# Patient Record
Sex: Male | Born: 1975 | Race: White | Hispanic: No | Marital: Married | Smoking: Never smoker
Health system: Southern US, Community
[De-identification: ages and names within clinical notes are randomized; demographics above are authoritative.]

---

## 2006-12-07 ENCOUNTER — Ambulatory Visit: Payer: Self-pay | Admitting: Family Medicine

## 2006-12-08 ENCOUNTER — Encounter: Admission: RE | Admit: 2006-12-08 | Discharge: 2006-12-08 | Payer: Self-pay | Admitting: Family Medicine

## 2006-12-14 ENCOUNTER — Encounter: Admission: RE | Admit: 2006-12-14 | Discharge: 2006-12-14 | Payer: Self-pay | Admitting: Family Medicine

## 2007-06-21 ENCOUNTER — Ambulatory Visit: Payer: Self-pay | Admitting: Family Medicine

## 2008-04-14 ENCOUNTER — Ambulatory Visit: Payer: Self-pay | Admitting: Family Medicine

## 2008-05-09 IMAGING — US US ABDOMEN COMPLETE
1 series · 14 of 25 positions shown · non-contrast
Comparison: None.

CLINICAL DATA: Right upper quadrant abdominal pain.

COMPLETE ABDOMEN ULTRASOUND
TECHNIQUE: Complete abdominal ultrasound examination was performed including
evaluation of the liver, gallbladder, bile ducts, pancreas, kidneys, spleen,
IVC, and abdominal aorta.

[Series 1: unknown · 0.32mm/px · 14 of 78 slices shown]
[im 1/78]
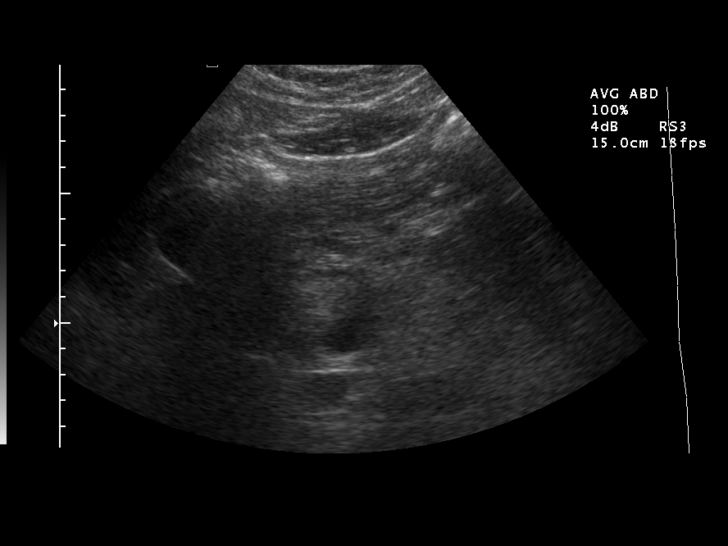
[im 7/78]
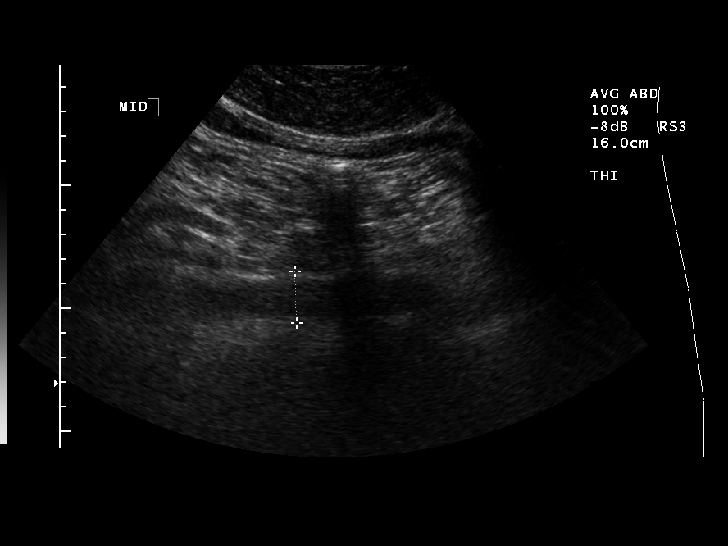
[im 13/78]
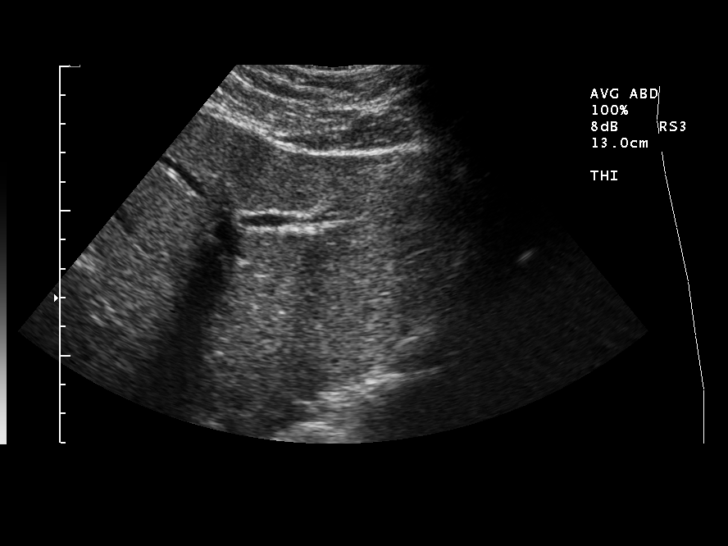
[im 20/78]
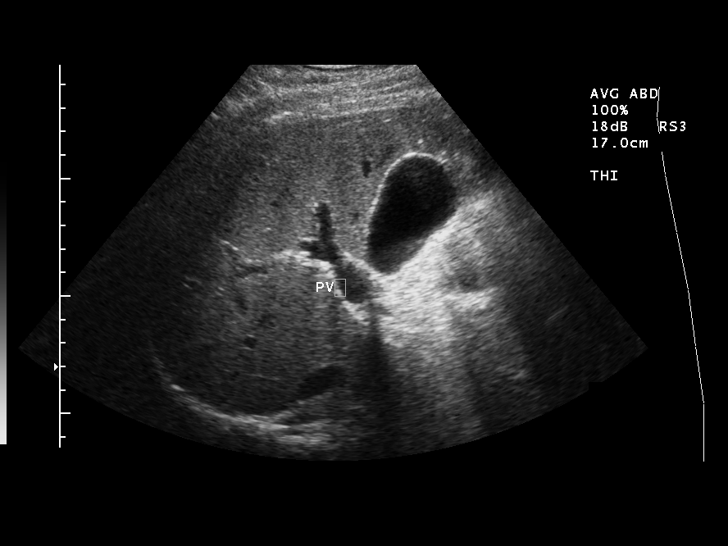
[im 26/78]
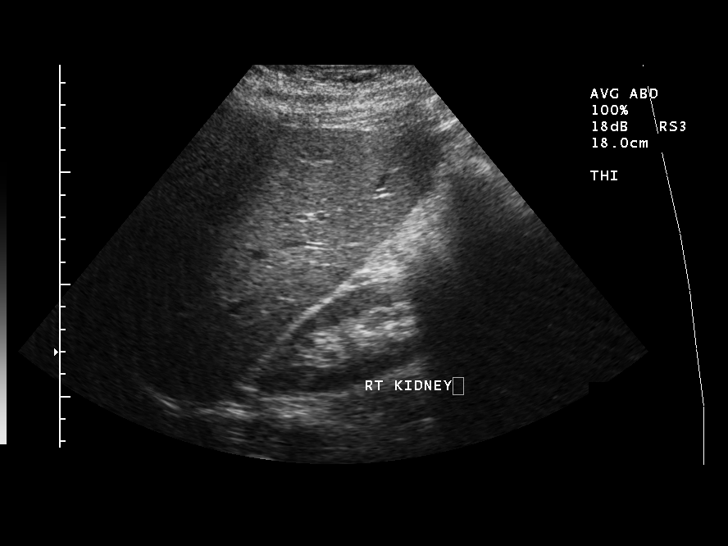
[im 29/78]
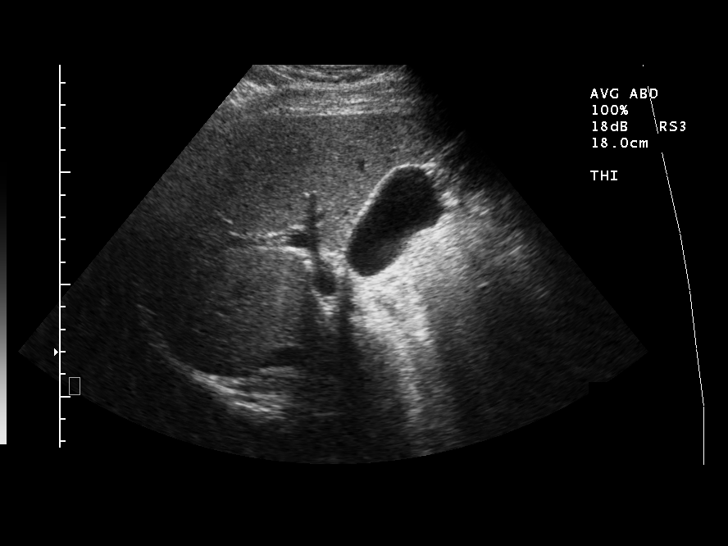
[im 36/78]
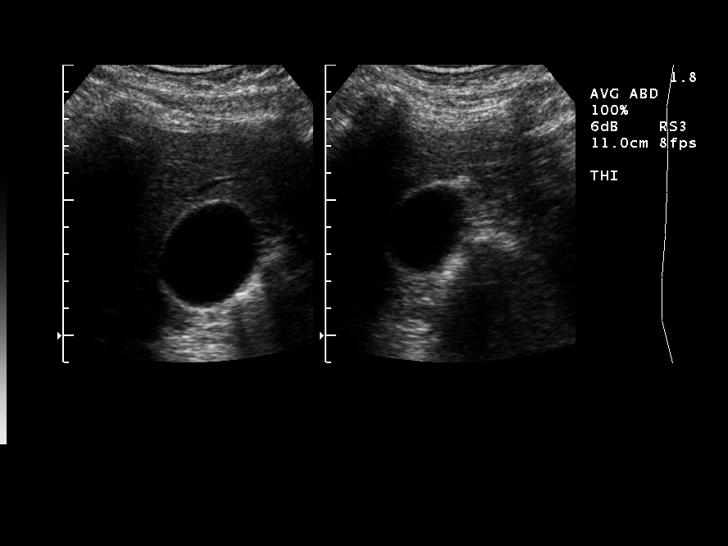
[im 42/78]
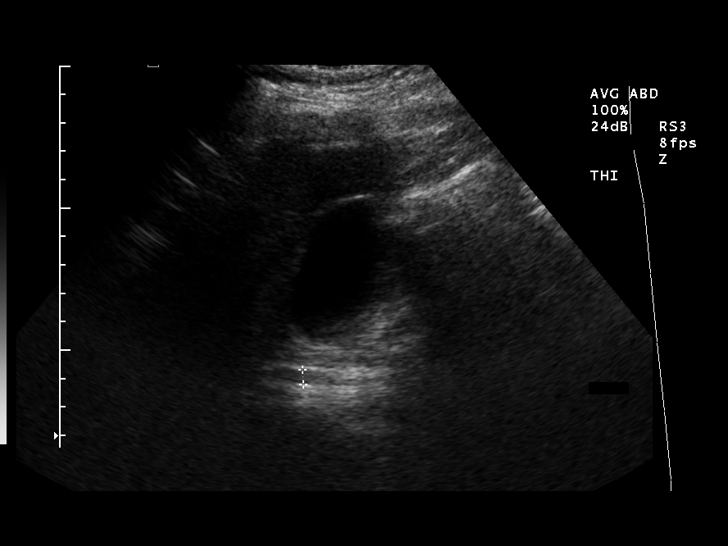
[im 49/78]
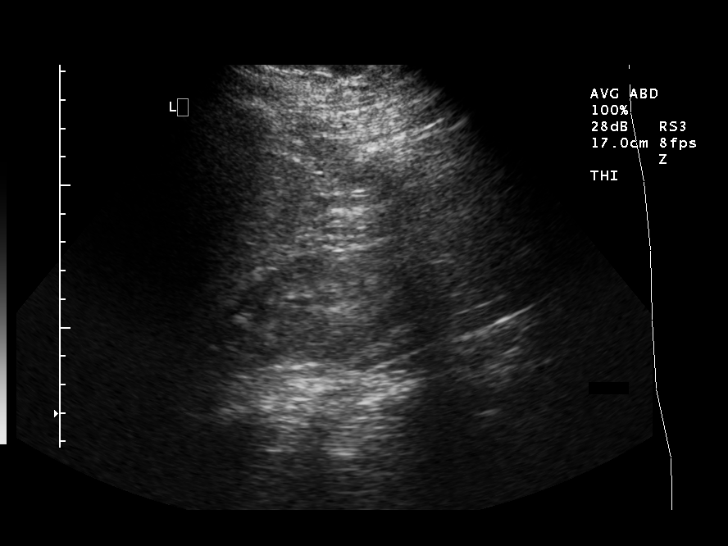
[im 52/78]
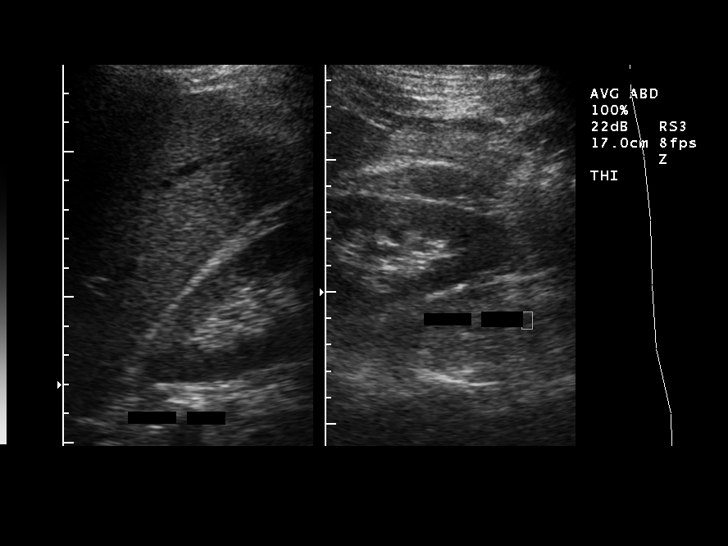
[im 58/78]
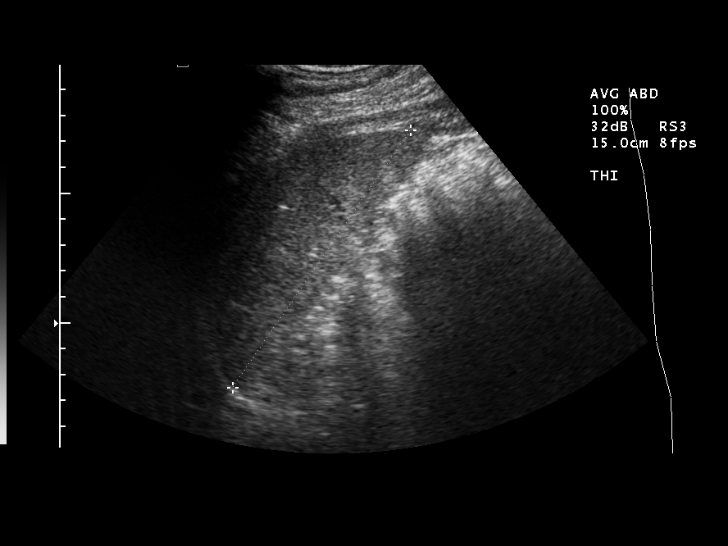
[im 65/78]
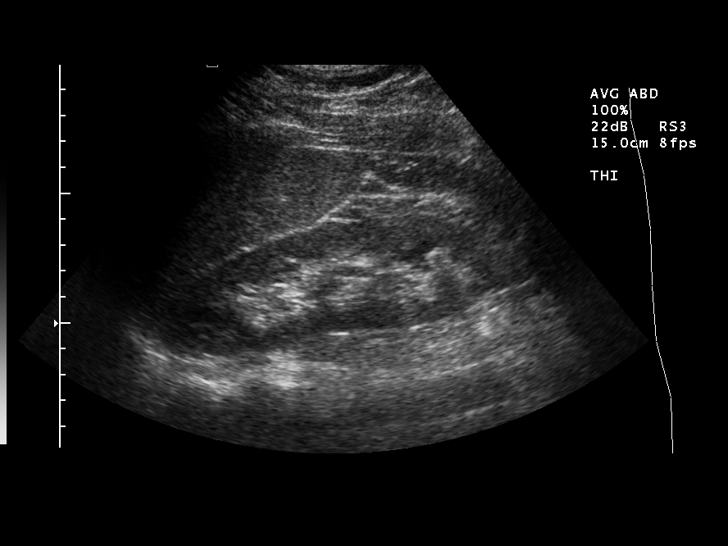
[im 71/78]
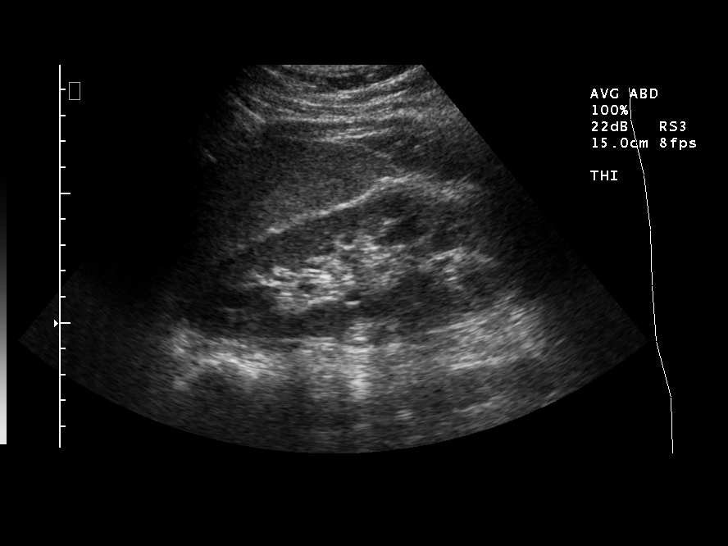
[im 78/78]
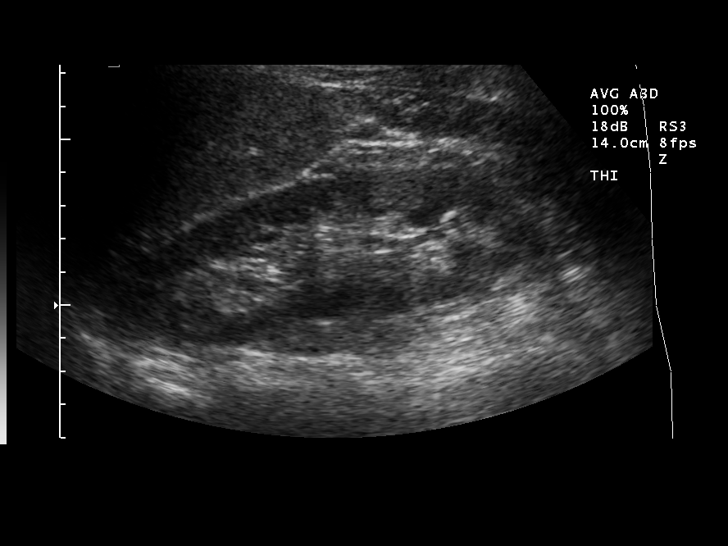

[14 of 25 positions shown; findings below may reference images not displayed]

FINDINGS: Poorly visualized pancreas and portions of the inferior vena cava.
The gallbladder, liver, spleen, kidneys and abdominal aorta have normal
appearances. No gallstones, biliary ductal dilatation or free peritoneal fluid. 
The common duct measures 5.2 mm in maximum diameter proximally.

IMPRESSION

Poorly visualized pancreas and portions of the inferior vena cava. Otherwise,
normal examination.

## 2008-05-15 IMAGING — NM NM HEPATO W/GB/PHARM/[PERSON_NAME]
5 series · 10 of 10 positions shown · non-contrast
Comparison: Abdominal Ultrasound, 12/08/06.

CLINICAL DATA: Right upper quadrant abdominal pain.
 HEPATOBILIARY SCAN WITH GALLBLADDER EJECTION FRACTION:
TECHNIQUE: Sequential abdominal images were obtained following intravenous injection of radiopharmaceutical.  Sequential images were continued following oral ingestion of 8 oz. Half & Half Cream, and the gallbladder ejection fraction was calculated.
 Radiopharmaceutical:  5 mCi 1c-77m Choletec

[gb hepatobiliary · 1 of 1 slices shown (1 of 5)]
[im 1/1]
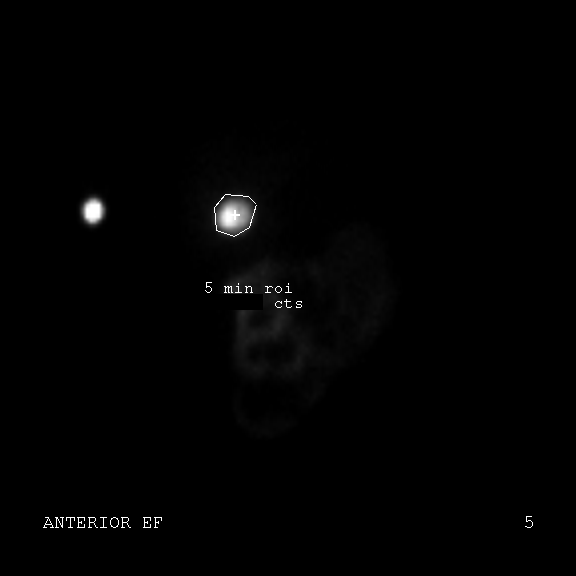

[gb hepatobiliary · 1 of 1 slices shown (2 of 5)]
[im 1/1]
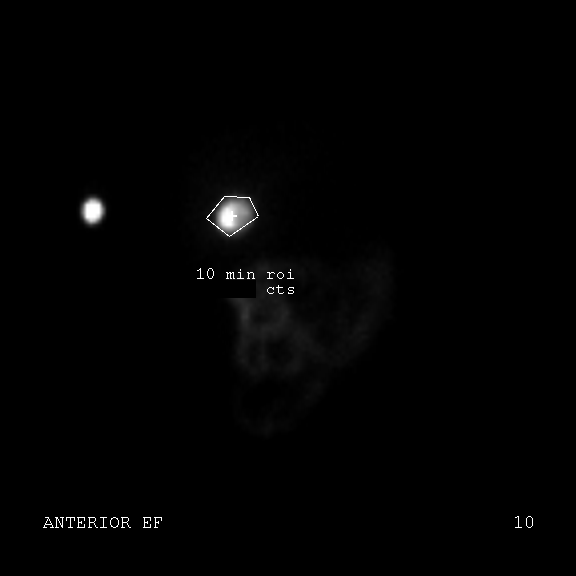

[gb hepatobiliary · 4.66mm/px · 6 of 12 frames shown (3 of 5)]
[frame 2/12]
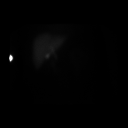
[frame 4/12]
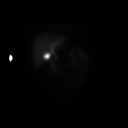
[frame 6/12]
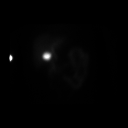
[frame 8/12]
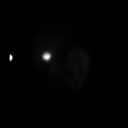
[frame 10/12]
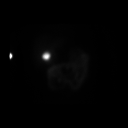
[frame 12/12]
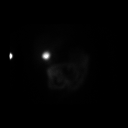

[gb hepatobiliary · 1 of 1 slices shown (4 of 5)]
[im 1/1]
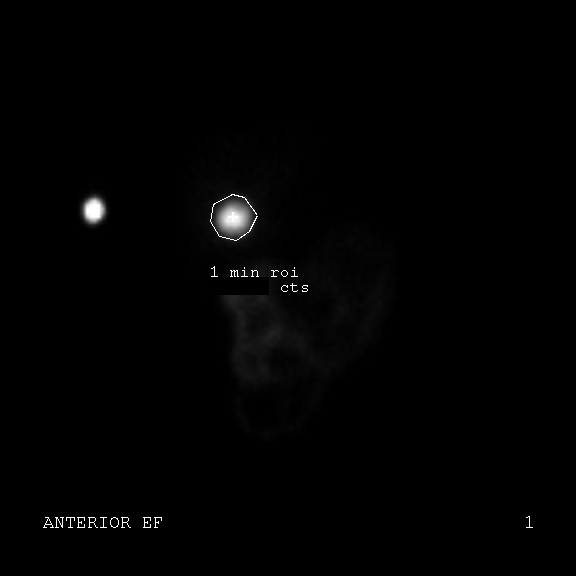

[gb hepatobiliary · 1 of 1 slices shown (5 of 5)]
[im 1/1]
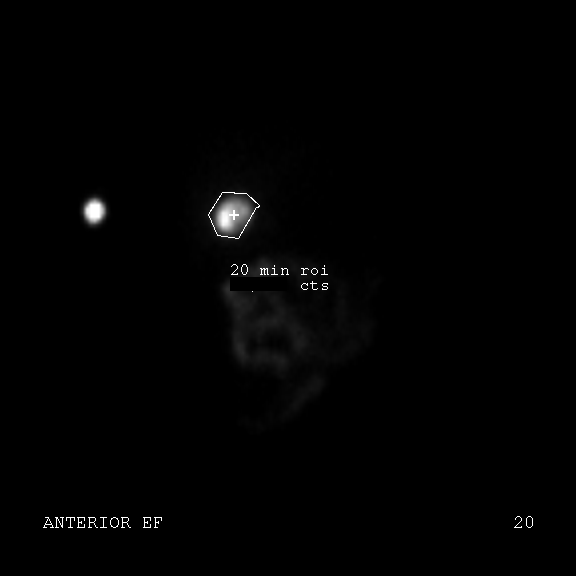

[10 of 10 positions shown; findings below may reference images not displayed]

FINDINGS: The initial images demonstrate homogeneous hepatic activity and prompt filling of the gallbladder. There is spontaneous drainage into the small bowel.  
 The stimulated portion demonstrates relatively limited gallbladder contraction.  The gallbladder ejection fraction calculated at one hour is 33%.  Normal is considered greater than 50% at this interval.
IMPRESSION: 1.  The cystic and common bile ducts are patent. 
 2.  Gallbladder ejection fraction is mildly decreased at 33% at one hour.

## 2008-10-09 ENCOUNTER — Ambulatory Visit (HOSPITAL_BASED_OUTPATIENT_CLINIC_OR_DEPARTMENT_OTHER): Admission: RE | Admit: 2008-10-09 | Discharge: 2008-10-10 | Payer: Self-pay | Admitting: Orthopedic Surgery

## 2008-10-27 ENCOUNTER — Ambulatory Visit: Payer: Self-pay | Admitting: Family Medicine

## 2009-03-12 ENCOUNTER — Encounter (INDEPENDENT_AMBULATORY_CARE_PROVIDER_SITE_OTHER): Payer: Self-pay | Admitting: *Deleted

## 2009-03-12 ENCOUNTER — Ambulatory Visit: Payer: Self-pay | Admitting: Sports Medicine

## 2009-03-12 DIAGNOSIS — M7650 Patellar tendinitis, unspecified knee: Secondary | ICD-10-CM | POA: Insufficient documentation

## 2009-07-30 ENCOUNTER — Ambulatory Visit: Payer: Self-pay | Admitting: Sports Medicine

## 2009-07-30 DIAGNOSIS — M722 Plantar fascial fibromatosis: Secondary | ICD-10-CM | POA: Insufficient documentation

## 2009-07-30 DIAGNOSIS — M7660 Achilles tendinitis, unspecified leg: Secondary | ICD-10-CM | POA: Insufficient documentation

## 2009-07-30 DIAGNOSIS — M2140 Flat foot [pes planus] (acquired), unspecified foot: Secondary | ICD-10-CM | POA: Insufficient documentation

## 2009-09-10 ENCOUNTER — Ambulatory Visit: Payer: Self-pay | Admitting: Sports Medicine

## 2009-09-30 ENCOUNTER — Ambulatory Visit: Payer: Self-pay | Admitting: Sports Medicine

## 2009-09-30 DIAGNOSIS — M79609 Pain in unspecified limb: Secondary | ICD-10-CM | POA: Insufficient documentation

## 2009-12-16 ENCOUNTER — Ambulatory Visit: Payer: Self-pay | Admitting: Family Medicine

## 2010-01-27 ENCOUNTER — Ambulatory Visit: Payer: Self-pay | Admitting: Family Medicine

## 2010-09-23 ENCOUNTER — Ambulatory Visit: Payer: Self-pay | Admitting: Family Medicine

## 2011-04-05 ENCOUNTER — Ambulatory Visit (INDEPENDENT_AMBULATORY_CARE_PROVIDER_SITE_OTHER): Payer: BC Managed Care – PPO | Admitting: Family Medicine

## 2011-04-05 DIAGNOSIS — J209 Acute bronchitis, unspecified: Secondary | ICD-10-CM

## 2011-04-26 NOTE — Op Note (Signed)
NAMEJERON, Jerry Powers            ACCOUNT NO.:  [id number]   MEDICAL RECORD NO.:  [id number]          PATIENT TYPE:  AMB   LOCATION:  [facility]                          FACILITY:  [facility]   PHYSICIAN:  Loreta Ave, M.D. DATE OF BIRTH:  1976-10-29   DATE OF PROCEDURE:  10/09/2008  DATE OF DISCHARGE:                               OPERATIVE REPORT   PREOPERATIVE DIAGNOSES:  1. Impingement.  2. Distal clavicle osteolysis.  3. Partial tearing rotator cuff, right shoulder.   POSTOPERATIVE DIAGNOSES:  1. Impingement.  2. Distal clavicle osteolysis.  3. Partial tearing rotator cuff, right shoulder.  4. Anterior labrum tear.   PROCEDURE:  1. Right shoulder exam under anesthesia.  2. Arthroscopy with debridement of rotator cuff labrum.  3. Bursectomy.  4. Acromioplasty.  5. CA ligament release.  6. Excision distal clavicle.   SURGEON:  Loreta Ave, MD   ASSISTANT:  Genene Churn. Barry Dienes, [place]   ANESTHESIA:  General.   BLOOD LOSS:  Minimal.   SPECIMENS:  None.   COMPLICATIONS:  None.   DRESSINGS:  Soft compressive with sling.   PROCEDURE:  The patient was brought to the operating room, placed on  operating table in supine position.  After adequate anesthesia had been  obtained, right shoulder examined.  Good motion, good stability.  Placed  in beach-chair position on the shoulder positioner, prepped and draped  in usual sterile fashion.  Three portals created, one each anterior,  posterior, and lateral.  Shoulder entered with blunt obturator,  arthroscope introduced, shoulder distended and inspected.  Complex  tearing anterior and posterior labrum debrided.  No instability pattern.  Biceps tendon, biceps anchor, capsule ligamentous structures, articular  cartilage intact.  From below, the rotator cuff looked excellent.  Cannula redirected subacromially.  Obvious impingement, type 2 acromion  abrasive change on top of the cuff.  No full-thickness tears.  Cuff  debrided.  Bursa  resected.  Acromioplasty to a type 1 acromion releasing  CA ligament and cautery.  Distal clavicle grade 4 changes with marked  spurring.  Periarticular spurs and lateral centimeter of clavicle  resected.  Adequacy of decompression, clavicle excision, and cuff  debridement confirmed viewing from all portals.  Instruments and fluid  removed.  Portals injected with Marcaine, closed with nylon.  Sterile  compressive dressing applied.  Sling applied.  Anesthesia reversed.  Brought to recovery room.  Tolerated the surgery well.  No  complications.      Loreta Ave, M.D.  Electronically Signed     DFM/MEDQ  D:  10/09/2008  T:  10/10/2008  Job:  [id number]

## 2011-11-18 ENCOUNTER — Ambulatory Visit: Payer: BC Managed Care – PPO

## 2011-12-23 ENCOUNTER — Other Ambulatory Visit: Payer: Self-pay | Admitting: Family Medicine

## 2011-12-23 NOTE — Telephone Encounter (Signed)
Is this ok?

## 2012-01-11 ENCOUNTER — Ambulatory Visit (INDEPENDENT_AMBULATORY_CARE_PROVIDER_SITE_OTHER): Payer: BC Managed Care – PPO | Admitting: Family Medicine

## 2012-01-11 ENCOUNTER — Encounter: Payer: Self-pay | Admitting: Family Medicine

## 2012-01-11 VITALS — BP 112/70 | HR 64 | Temp 98.0°F | Ht 72.0 in | Wt 200.0 lb

## 2012-01-11 DIAGNOSIS — R197 Diarrhea, unspecified: Secondary | ICD-10-CM

## 2012-01-11 NOTE — Patient Instructions (Signed)
Use Imodium for the diarrhea and eat anything you want. If you still have symptoms next week, let me know

## 2012-01-11 NOTE — Progress Notes (Signed)
  Subjective:    Patient ID: Jerry Powers, male    DOB: 03-Nov-1976, 36 y.o.   MRN: [id number]  HPI He has a three-day history of diarrhea but no fever, chills, blood or pus in the stool, nausea or vomiting. No other family members have had difficulty. He has not been traveling or been drinking well water. He cannot relate this to any foods.   Review of Systems     Objective:   Physical Exam alert and in no distress. Tympanic membranes and canals are normal. Throat is clear. Tonsils are normal. Neck is supple without adenopathy or thyromegaly. Cardiac exam shows a regular sinus rhythm without murmurs or gallops. Lungs are clear to auscultation.        Assessment & Plan:  Diarrhea. Supportive care including using Imodium. He was instructed to eating anything he wants

## 2012-07-04 ENCOUNTER — Ambulatory Visit (INDEPENDENT_AMBULATORY_CARE_PROVIDER_SITE_OTHER): Payer: BC Managed Care – PPO | Admitting: Family Medicine

## 2012-07-04 ENCOUNTER — Encounter: Payer: Self-pay | Admitting: Family Medicine

## 2012-07-04 VITALS — BP 110/70 | HR 61 | Temp 98.1°F | Wt 198.0 lb

## 2012-07-04 DIAGNOSIS — H6980 Other specified disorders of Eustachian tube, unspecified ear: Secondary | ICD-10-CM

## 2012-07-04 DIAGNOSIS — J309 Allergic rhinitis, unspecified: Secondary | ICD-10-CM

## 2012-07-04 DIAGNOSIS — H6120 Impacted cerumen, unspecified ear: Secondary | ICD-10-CM

## 2012-07-04 NOTE — Patient Instructions (Signed)
Use Afrin daily for the next 3 days and also use a nasal steroid spray

## 2012-07-04 NOTE — Progress Notes (Signed)
  Subjective:    Patient ID: Jerry Powers, male    DOB: Aug 18, 1976, 36 y.o.   MRN: [id number]  HPI About 3 days ago he developed left ear congestion and popping sensation followed by some sinus pressure and occasional PND. He now is having some difficulty with dizziness. No fever, chills, sore throat, upper tooth discomfort. He does have underlying allergies and usually uses Sudafed and chlorpheniramine. He does not smoke.   Review of Systems     Objective:   Physical Exam alert and in no distress. Tympanic membranes and canals are normal after cerumen was removed from the left canal. Throat is clear. Tonsils are normal. Neck is supple without adenopathy or thyromegaly. Cardiac exam shows a regular sinus rhythm without murmurs or gallops. Lungs are clear to auscultation.        Assessment & Plan:   1. Eustachian tube dysfunction  Ear cerumen removal  2. Allergic rhinitis, mild     he will continue on his allergy medications and start taking the nasal steroid again. Recommended 3 days of Afrin nasal spray to see if this will help with his ear symptoms. Explained that I did not think his symptoms were infection related. He is comfortable with this approach.

## 2012-07-10 ENCOUNTER — Encounter: Payer: Self-pay | Admitting: Family Medicine

## 2012-07-10 ENCOUNTER — Ambulatory Visit (INDEPENDENT_AMBULATORY_CARE_PROVIDER_SITE_OTHER): Payer: BC Managed Care – PPO | Admitting: Family Medicine

## 2012-07-10 VITALS — BP 110/70 | HR 60 | Temp 97.8°F | Wt 197.0 lb

## 2012-07-10 DIAGNOSIS — H60399 Other infective otitis externa, unspecified ear: Secondary | ICD-10-CM

## 2012-07-10 DIAGNOSIS — H6092 Unspecified otitis externa, left ear: Secondary | ICD-10-CM

## 2012-07-10 MED ORDER — NEOMYCIN-POLYMYXIN-HC 3.5-10000-1 OT SUSP: 3.0000 [drp] | Freq: Three times a day (TID) | OTIC | Status: AC

## 2012-07-10 NOTE — Progress Notes (Signed)
  Subjective:    Patient ID: Jerry Powers, male    DOB: 06/03/1976, 36 y.o.   MRN: [id number]  HPI A recheck on left ear pain. He does state that he is having less difficulty with congestion but not having difficulty with pain. He notes that if he chews or moves his ear, he will have pain.   Review of Systems     Objective:   Physical Exam Right TM and canal normal. Left TM is normal however the canal is slightly erythematous. Neck is supple without adenopathy. Throat is clear.       Assessment & Plan:   1. Otitis externa, left  neomycin-polymyxin-hydrocortisone (CORTISPORIN) 3.5-10000-1 otic suspension   also recommend anti-inflammatory of choice. Call if further difficulty.

## 2013-01-22 ENCOUNTER — Other Ambulatory Visit: Payer: Self-pay | Admitting: Family Medicine

## 2013-01-23 NOTE — Telephone Encounter (Signed)
Is this okay to refill? 

## 2013-07-30 ENCOUNTER — Ambulatory Visit (INDEPENDENT_AMBULATORY_CARE_PROVIDER_SITE_OTHER): Payer: BC Managed Care – PPO | Admitting: Family Medicine

## 2013-07-30 ENCOUNTER — Encounter: Payer: Self-pay | Admitting: Family Medicine

## 2013-07-30 VITALS — BP 110/60 | HR 61 | Temp 98.1°F | Wt 191.0 lb

## 2013-07-30 DIAGNOSIS — J209 Acute bronchitis, unspecified: Secondary | ICD-10-CM

## 2013-07-30 DIAGNOSIS — J019 Acute sinusitis, unspecified: Secondary | ICD-10-CM

## 2013-07-30 MED ORDER — CLARITHROMYCIN 500 MG PO TABS: 500.0000 mg | ORAL_TABLET | Freq: Two times a day (BID) | ORAL | Status: DC

## 2013-07-30 NOTE — Progress Notes (Signed)
  Subjective:    Patient ID: Jerry Powers, male    DOB: [birth year], 37 y.o.   MRN: [id number]  HPI Approximately 2 weeks ago he developed a sore throat followed by nasal congestion, dry cough, slight sore throat did go away.. he did get worse approximately one week ago and did use Afrin which did help. He is now experiencing more nasal congestion, PND and productive cough.   Review of Systems     Objective:   Physical Exam alert and in no distress. Tympanic membranes and canals are normal. Throat is clear. Tonsils are normal. Neck is supple without adenopathy or thyromegaly. Cardiac exam shows a regular sinus rhythm without murmurs or gallops. Lungs are clear to auscultation. Nasal mucosa is slightly red and swollen, tender over frontal sinuses.        Assessment & Plan:  Acute sinusitis - Plan: clarithromycin (BIAXIN) 500 MG tablet  Acute bronchitis - Plan: clarithromycin (BIAXIN) 500 MG tablet  he is to call if not entirely better when he finishes the antibiotic.

## 2013-10-15 ENCOUNTER — Encounter: Payer: Self-pay | Admitting: Family Medicine

## 2013-10-15 ENCOUNTER — Ambulatory Visit (INDEPENDENT_AMBULATORY_CARE_PROVIDER_SITE_OTHER): Payer: 59 | Admitting: Family Medicine

## 2013-10-15 VITALS — BP 100/60 | HR 64 | Ht 72.0 in | Wt 188.0 lb

## 2013-10-15 DIAGNOSIS — Z8619 Personal history of other infectious and parasitic diseases: Secondary | ICD-10-CM

## 2013-10-15 DIAGNOSIS — Z23 Encounter for immunization: Secondary | ICD-10-CM

## 2013-10-15 DIAGNOSIS — J301 Allergic rhinitis due to pollen: Secondary | ICD-10-CM

## 2013-10-15 DIAGNOSIS — I7300 Raynaud's syndrome without gangrene: Secondary | ICD-10-CM

## 2013-10-15 DIAGNOSIS — Z0000 Encounter for general adult medical examination without abnormal findings: Secondary | ICD-10-CM

## 2013-10-15 LAB — CBC WITH DIFFERENTIAL/PLATELET
Basophils Absolute: 0 10*3/uL (ref 0.0–0.1)
Basophils Relative: 1 % (ref 0–1)
Hemoglobin: 15.6 g/dL (ref 13.0–17.0)
Lymphs Abs: 1.9 10*3/uL (ref 0.7–4.0)
MCHC: 34.8 g/dL (ref 30.0–36.0)
Neutro Abs: 2.7 10*3/uL (ref 1.7–7.7)
RBC: 4.96 MIL/uL (ref 4.22–5.81)
RDW: 13.5 % (ref 11.5–15.5)

## 2013-10-15 LAB — LIPID PANEL
HDL: 52 mg/dL (ref >39)
Total CHOL/HDL Ratio: 3.7 Ratio

## 2013-10-15 LAB — POCT URINALYSIS DIPSTICK
Ketones, UA: NEGATIVE
Leukocytes, UA: NEGATIVE
Protein, UA: NEGATIVE
Urobilinogen, UA: NEGATIVE
pH, UA: 7

## 2013-10-15 LAB — COMPREHENSIVE METABOLIC PANEL
AST: 26 U/L (ref 0–37)
BUN: 15 mg/dL (ref 6–23)
Calcium: 10 mg/dL (ref 8.4–10.5)
Chloride: 99 mEq/L (ref 96–112)
Creat: 1.11 mg/dL (ref 0.50–1.35)
Potassium: 4.1 mEq/L (ref 3.5–5.3)
Sodium: 137 mEq/L (ref 135–145)

## 2013-10-15 NOTE — Progress Notes (Signed)
Subjective:    Patient ID: Jerry Powers, male    DOB: [birth year], 37 y.o.   MRN: [id number]  HPI He is here for complete examination. He notes that last spring in the cold weather he noted his hands becoming purplish and in white. He has not had any trouble since then. He has had no fever, chills, weight change, pulmonary symptoms, GI problems. He does have underlying history of herpes labialis and does use Valtrex periodically. He also has underlying allergies mainly dealing with congestion. He also notes bilateral hand tingling sensation if he put something to restrictive on his forearms or when he is driving but then demonstrates a normal driving position of his hands and elbows. Family and social history were reviewed. He is engaged but does not have immediate plans to married.   Review of Systems  Constitutional: Negative.   HENT: Negative.   Eyes: Negative.   Respiratory: Negative.   Cardiovascular: Negative.   Gastrointestinal: Negative.   Endocrine: Negative.   Genitourinary: Negative.   Musculoskeletal: Negative.   Allergic/Immunologic: Negative.   Hematological: Negative.        Objective:   Physical Exam BP 100/60  Pulse 64  Ht 6' (1.829 m)  Wt 188 lb (85.276 kg)  BMI 25.49 kg/m2  SpO2 98%  General Appearance:    Alert, cooperative, no distress, appears stated age  Head:    Normocephalic, without obvious abnormality, atraumatic  Eyes:    PERRL, conjunctiva/corneas clear, EOM's intact, fundi    benign  Ears:    Normal TM's and external ear canals  Nose:   Nares normal, mucosa normal, no drainage or sinus   tenderness  Throat:   Lips, mucosa, and tongue normal; teeth and gums normal  Neck:   Supple, no lymphadenopathy;  thyroid:  no   enlargement/tenderness/nodules; no carotid   bruit or JVD  Back:    Spine nontender, no curvature, ROM normal, no CVA     tenderness  Lungs:     Clear to auscultation bilaterally without wheezes, rales or     ronchi; respirations  unlabored  Chest Wall:    No tenderness or deformity   Heart:    Regular rate and rhythm, S1 and S2 normal, no murmur, rub   or gallop  Breast Exam:    No chest wall tenderness, masses or gynecomastia  Abdomen:     Soft, non-tender, nondistended, normoactive bowel sounds,    no masses, no hepatosplenomegaly  Genitalia:    Normal male external genitalia without lesions.  Testicles without masses.  No inguinal hernias.  Rectal:   Deferred due to age <40 and lack of symptoms  Extremities:   No clubbing, cyanosis or edema  Pulses:   2+ and symmetric all extremities  Skin:   Skin color, texture, turgor normal, no rashes or lesions  Lymph nodes:   Cervical, supraclavicular, and axillary nodes normal  Neurologic:   CNII-XII intact, normal strength, sensation and gait; reflexes 2+ and symmetric throughout          Psych:   Normal mood, affect, hygiene and grooming.          Assessment & Plan:  Routine general medical examination at a health care facility - Plan: POCT Urinalysis Dipstick, CBC with Differential, Comprehensive metabolic panel, Lipid panel  Need for prophylactic vaccination and inoculation against influenza - Plan: Flu Vaccine QUAD 36+ mos PF IM (Fluarix)  Raynaud's disease  History of herpes labialis  Allergic rhinitis due to pollen  he will call when he needs a refill on his Valtrex. He will continue to treat his allergies. Discussed Raynaud's disease versus phenomenon with him. Also discussed the symptoms he is having in his arms. Discussed the fact that we will take a watchful waiting approach to this and pursue this further if he continues had difficulty since the symptoms are quite vague and do not point towards anything in particular.

## 2014-05-28 ENCOUNTER — Encounter: Payer: Self-pay | Admitting: Family Medicine

## 2014-05-28 ENCOUNTER — Ambulatory Visit (INDEPENDENT_AMBULATORY_CARE_PROVIDER_SITE_OTHER): Payer: 59 | Admitting: Family Medicine

## 2014-05-28 VITALS — BP 94/62 | Wt 194.0 lb

## 2014-05-28 DIAGNOSIS — R079 Chest pain, unspecified: Secondary | ICD-10-CM

## 2014-05-28 NOTE — Progress Notes (Signed)
   Subjective:    Patient ID: Jerry Powers, male    DOB: Oct 22, 1976, 38 y.o.   MRN: [id number]  HPI He has an 8 week history of difficulty with a mild left-sided chest ache no shortness of breath, diaphoresis or weakness. He does notice that it occurs more with activity however he has been on the treadmill and had this occur, kept running in the pain went away. He did have one episode prior to this and was seen in an urgent care Center with negative results. Motion, food and breathing makes no difference.   Review of Systems     Objective:   Physical Exam alert and in no distress. Tympanic membranes and canals are normal. Throat is clear. Tonsils are normal. Neck is supple without adenopathy or thyromegaly. Cardiac exam shows a regular sinus rhythm without murmurs or gallops. Lungs are clear to auscultation. EKG shows no acute changes       Assessment & Plan:  Chest pain - Plan: EKG 12-Lead  I explained that I could tell him a lot of things he was not rather than what it is. Recommend he keep track of anything that makes it better or worse and return here for further evaluation.

## 2014-05-28 NOTE — Patient Instructions (Signed)
Pay attention to anything that would make your symptoms either better or worse and let he know.

## 2014-07-21 ENCOUNTER — Ambulatory Visit (INDEPENDENT_AMBULATORY_CARE_PROVIDER_SITE_OTHER): Payer: 59 | Admitting: Family Medicine

## 2014-07-21 VITALS — BP 118/70 | HR 68 | Temp 97.8°F | Wt 197.0 lb

## 2014-07-21 DIAGNOSIS — J209 Acute bronchitis, unspecified: Secondary | ICD-10-CM

## 2014-07-21 MED ORDER — BENZONATATE 100 MG PO CAPS: 100.0000 mg | ORAL_CAPSULE | Freq: Three times a day (TID) | ORAL | Status: DC | PRN

## 2014-07-21 MED ORDER — CLARITHROMYCIN 500 MG PO TABS: 500.0000 mg | ORAL_TABLET | Freq: Two times a day (BID) | ORAL | Status: DC

## 2014-07-21 NOTE — Progress Notes (Signed)
   Subjective:    Patient ID: Jerry Powers, male    DOB: 04-17-1976, 38 y.o.   MRN: [id number]  HPI He complains of a ten-day history of slight cough and chest congestion congestion and coughing had gotten worse and that kept him from sleeping. He also complains of nasal congestion and PND no fever, chills, sore throat or earache. He does not smoke and does have a history of allergies.   Review of Systems     Objective:   Physical Exam alert and in no distress. Tympanic membranes and canals are normal. Throat is clear. Tonsils are normal. Neck is supple without adenopathy or thyromegaly. Cardiac exam shows a regular sinus rhythm without murmurs or gallops. Lungs are clear to auscultation.        Assessment & Plan:  Acute bronchitis, unspecified organism - Plan: clarithromycin (BIAXIN) 500 MG tablet, benzonatate (TESSALON) 100 MG capsule  he is to call if not entirely better when he finishes the antibiotic.

## 2014-09-18 ENCOUNTER — Other Ambulatory Visit: Payer: Self-pay | Admitting: Medical

## 2014-09-18 ENCOUNTER — Telehealth: Payer: Self-pay | Admitting: Family Medicine

## 2014-09-18 MED ORDER — ACYCLOVIR 400 MG PO TABS: ORAL_TABLET | ORAL | Status: DC

## 2014-09-18 NOTE — Telephone Encounter (Signed)
Pt states since his insurance has changed to [place] for #12 however Acyclovir will cost $5. Pt need refill on this med now. Can he get Acyclovir this time?

## 2014-09-18 NOTE — Telephone Encounter (Signed)
Called pt to advise that alternate med sent to pharmacy

## 2014-09-18 NOTE — Telephone Encounter (Signed)
Acyclovir sent as alternate.

## 2014-10-11 ENCOUNTER — Encounter: Payer: Self-pay | Admitting: Internal Medicine

## 2015-02-19 ENCOUNTER — Encounter: Payer: Self-pay | Admitting: Family Medicine

## 2015-02-19 ENCOUNTER — Ambulatory Visit (INDEPENDENT_AMBULATORY_CARE_PROVIDER_SITE_OTHER): Payer: 59 | Admitting: Family Medicine

## 2015-02-19 VITALS — BP 126/74 | HR 62 | Temp 98.4°F | Wt 199.6 lb

## 2015-02-19 DIAGNOSIS — L989 Disorder of the skin and subcutaneous tissue, unspecified: Secondary | ICD-10-CM

## 2015-02-19 DIAGNOSIS — M8430XG Stress fracture, unspecified site, subsequent encounter for fracture with delayed healing: Secondary | ICD-10-CM

## 2015-02-19 DIAGNOSIS — J209 Acute bronchitis, unspecified: Secondary | ICD-10-CM

## 2015-02-19 MED ORDER — AZITHROMYCIN 500 MG PO TABS: 500.0000 mg | ORAL_TABLET | Freq: Every day | ORAL | Status: DC

## 2015-02-19 MED ORDER — BENZONATATE 100 MG PO CAPS: 100.0000 mg | ORAL_CAPSULE | Freq: Three times a day (TID) | ORAL | Status: DC | PRN

## 2015-02-19 NOTE — Progress Notes (Signed)
   Subjective:    Patient ID: Jerry Powers, male    DOB: 06-Aug-1976, 39 y.o.   MRN: [id number]  HPI He complains of a one-week history that started with dry cough followed by malaise. The cough has gotten worse as well as hoarse voice and slightly productive cough. No fever, chills, sore throat or earache. Also he had a lesion removed from his right shin in January and the area has yet to heal properly. He also has had a great deal difficulty with his right leg and was found to have a stress fracture that has not healed properly. He is being considered for plate or rod.   Review of Systems     Objective:   Physical Exam Alert and in no distress. Tympanic membranes and canals are normal. Pharyngeal area is normal. Neck is supple without adenopathy or thyromegaly. Cardiac exam shows a regular sinus rhythm without murmurs or gallops. Lungs are clear to auscultation. The right shin does show a poorly healing lesion with a did the incision and biopsy.        Assessment & Plan:  Acute bronchitis, unspecified organism - Plan: benzonatate (TESSALON) 100 MG capsule, azithromycin (ZITHROMAX) 500 MG tablet  Skin lesion of right leg  Stress fracture, with delayed healing, subsequent encounter  recommend he send a picture of the skin lesion back to the physician that did the surgery and find out if he was to revise this. We also discussed the stress fracture and care. I recommended a rod instead of plating.

## 2016-08-03 ENCOUNTER — Ambulatory Visit (HOSPITAL_COMMUNITY)
Admission: EM | Admit: 2016-08-03 | Discharge: 2016-08-03 | Disposition: A | Discharge status: Home / Self Care | Payer: Worker's Compensation | Attending: Family Medicine | Admitting: Family Medicine

## 2016-08-03 ENCOUNTER — Encounter (HOSPITAL_COMMUNITY): Payer: Self-pay | Admitting: Emergency Medicine

## 2016-08-03 DIAGNOSIS — L03818 Cellulitis of other sites: Secondary | ICD-10-CM

## 2016-08-03 MED ORDER — MUPIROCIN CALCIUM 2 % EX CREA: 1.0000 "application " | TOPICAL_CREAM | Freq: Two times a day (BID) | CUTANEOUS | 0 refills | Status: AC

## 2016-08-03 MED ORDER — CEPHALEXIN 500 MG PO CAPS: 500.0000 mg | ORAL_CAPSULE | Freq: Three times a day (TID) | ORAL | 0 refills | Status: AC

## 2016-08-03 NOTE — ED Triage Notes (Signed)
The patient presented to the [facility] with a complaint of a bee sting to his right flank that occurred 3 days ago. The patient stated that he has applied a topical steroid cream but it continues to swell.

## 2016-08-03 NOTE — Discharge Instructions (Signed)
Take benadryl for itchiness. Take the antibiotic as prescribed. Follow up with your primary care doctor if you do not improve.

## 2016-08-03 NOTE — ED Provider Notes (Signed)
CSN: [id number]     Arrival date & time 08/03/16  1841 History   First MD Initiated Contact with Patient 08/03/16 1945     Chief Complaint  Patient presents with  . 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 He did not see the actual insect but he suspect that it was a yellow jacket because he got stung by yellow jacket before and it feels the same this time. Patient reports redness and swelling at the site. He have tried topical steroid cream and reports no improvement. Patient endorses itchiness, denies pain. He is afebrile.       History reviewed. No pertinent past medical history. History reviewed. No pertinent surgical history. History reviewed. No pertinent family history. Social History  Substance Use Topics  . Smoking status: Never Smoker  . Smokeless tobacco: Never Used  . Alcohol use Yes     Comment: rare    Review of Systems  Constitutional: Negative for chills, fatigue and fever.  Respiratory: Negative for cough, shortness of breath and wheezing.   Cardiovascular: Negative for chest pain, palpitations and leg swelling.  Gastrointestinal: Negative for abdominal pain, diarrhea, nausea and vomiting.  Skin: Positive for rash.       Positive for rash and insect bit to his right anterior abdomen  Neurological: Negative for dizziness, weakness and numbness.    Allergies  Codeine; Penicillins; and Sulfonamide derivatives  Home Medications   Prior to Admission medications   Medication Sig Start Date End Date Taking? Authorizing Provider  loratadine (CLARITIN) 10 MG tablet Take 10 mg by mouth daily.   Yes Historical Provider, MD  Multiple Vitamins-Minerals (MULTIVITAMIN WITH MINERALS) tablet Take 1 tablet by mouth daily.   Yes Historical Provider, MD  acyclovir (ZOVIRAX) 400 MG tablet  1 tablet TID x 5 days for outbreak 09/18/14   Camelia Eng Tysinger, PA-C  azithromycin (ZITHROMAX) 500 MG tablet Take 1 tablet (500 mg total) by mouth daily. 02/19/15   Denita Lung, MD  benzonatate (TESSALON) 100 MG capsule Take 1 capsule (100 mg total) by mouth 3 (three) times daily as needed for cough. 02/19/15   Denita Lung, MD  cephALEXin (KEFLEX) 500 MG capsule Take 1 capsule (500 mg total) by mouth 3 (three) times daily. 08/03/16 08/10/16  Barry Dienes, NP  ibuprofen (ADVIL,MOTRIN) 200 MG tablet Take 200 mg by mouth every 6 (six) hours as needed.    Historical Provider, MD  mupirocin cream (BACTROBAN) 2 % Apply 1 application topically 2 (two) times daily. 08/03/16 08/10/16  Barry Dienes, NP   Meds Ordered and Administered this Visit  Medications - No data to display  BP 111/70 (BP Location: Left Arm)   Pulse (!) 59   Temp 98 F (36.7 C) (Oral)   Resp 18   Ht 6' (1.829 m)   Wt 204 lb (92.5 kg)   SpO2 100%   BMI 27.67 kg/m  No data found.   Physical Exam  Constitutional: He is oriented to person, place, and time. He appears well-developed and well-nourished.  HENT:  Head: Normocephalic and atraumatic.  Eyes: Conjunctivae are normal. Pupils are equal, round, and reactive to light.  Neck: Normal range of motion. Neck supple.  Cardiovascular: Normal rate, regular rhythm and normal heart sounds.   Pulmonary/Chest: Effort normal and breath sounds normal.  Lymphadenopathy:    He has no  cervical adenopathy.  Neurological: He is alert and oriented to person, place, and time.  Skin: Skin is warm and dry.     An area of erythema of 10cm x 5 cm noted at right anterior abdomen that is warm to touch.   Nursing note and vitals reviewed.   Urgent Care Course   Clinical Course    Procedures (including critical care time)  Labs Review Labs Reviewed - No data to display  Imaging Review No results found.     MDM   1. Cellulitis of other specified site    There is a concern for  localized cellulitis from the insect bite. Will treat with keflex TID x 7 days, and bactroban BID x 7 days. Reviewed directions for usage and side effects. Patient states understanding and will call with questions or problems. Patient instructed to call or follow up with his/her primary care doctor if failure to improve or change in symptoms. Discharge instruction given. Collaborating physician was consulted.     Barry Dienes, NP 08/03/16 2042

## 2016-08-12 ENCOUNTER — Encounter: Payer: Self-pay | Admitting: Family Medicine

## 2016-08-12 ENCOUNTER — Ambulatory Visit (INDEPENDENT_AMBULATORY_CARE_PROVIDER_SITE_OTHER): Payer: BLUE CROSS/BLUE SHIELD | Admitting: Family Medicine

## 2016-08-12 VITALS — BP 116/74 | HR 77 | Ht 72.0 in | Wt 208.0 lb

## 2016-08-12 DIAGNOSIS — K219 Gastro-esophageal reflux disease without esophagitis: Secondary | ICD-10-CM | POA: Diagnosis not present

## 2016-08-12 DIAGNOSIS — R079 Chest pain, unspecified: Secondary | ICD-10-CM | POA: Diagnosis not present

## 2016-08-12 NOTE — Progress Notes (Signed)
   Subjective:    Patient ID: Jerry Powers, male    DOB: 05/17/1976, 40 y.o.   MRN: [id number]  HPI He is here for consultation for evaluation of continued difficulty with intermittent chest wall pain. He was seen by me in the past as well as in the urgent care center. He describes the pain as left-sided and intermittent in nature occasionally being pleuritic in nature. He states today he worked out quite vigorously had the discomfort but it did not change and did not go away when he finishes the workout. He's had no shortness of breath, chest pressure/DOE. He does have an underlying history of upset stomach that he has used H2 blockers in the past for. He states that his upset stomach is due to gallbladder surgery several years ago. He does not really describe reflux.   Review of Systems     Objective:   Physical Exam Alert and in no distress. No palpable chest wall tenderness is noted. Cardiac exam shows regular rhythm without murmurs or gallops. Lungs clear to auscultation. EKG shows early repolarization otherwise normal       Assessment & Plan:  Gastroesophageal reflux disease, esophagitis presence not specified  Chest pain, unspecified chest pain type - Plan: EKG 12-Lead Recommend he use Prilosec 40 mg daily as well as 2 Aleve twice per day for the next 7-10 days to see if this will limit problems. If not further evaluation including possible cardiology will be done.

## 2016-08-12 NOTE — Patient Instructions (Signed)
Take 40 mg of Prilosec and 2 Aleve twice per day for the next week. If you're still having problems then we'll need to reevaluate and probably shipped to a cardiologist or

## 2016-09-29 ENCOUNTER — Encounter: Payer: Self-pay | Admitting: Family Medicine

## 2016-09-29 ENCOUNTER — Ambulatory Visit (INDEPENDENT_AMBULATORY_CARE_PROVIDER_SITE_OTHER): Payer: BLUE CROSS/BLUE SHIELD | Admitting: Family Medicine

## 2016-09-29 VITALS — BP 110/70 | HR 50 | Ht 72.5 in | Wt 207.0 lb

## 2016-09-29 DIAGNOSIS — Z8619 Personal history of other infectious and parasitic diseases: Secondary | ICD-10-CM

## 2016-09-29 DIAGNOSIS — Z0000 Encounter for general adult medical examination without abnormal findings: Secondary | ICD-10-CM

## 2016-09-29 DIAGNOSIS — J3089 Other allergic rhinitis: Secondary | ICD-10-CM

## 2016-09-29 DIAGNOSIS — I7300 Raynaud's syndrome without gangrene: Secondary | ICD-10-CM | POA: Diagnosis not present

## 2016-09-29 LAB — POCT URINALYSIS DIPSTICK
Bilirubin, UA: NEGATIVE
GLUCOSE UA: NEGATIVE
Ketones, UA: NEGATIVE
LEUKOCYTES UA: NEGATIVE
NITRITE UA: NEGATIVE
Protein, UA: NEGATIVE
RBC UA: NEGATIVE
Spec Grav, UA: 1.02
UROBILINOGEN UA: NEGATIVE
pH, UA: 6

## 2016-09-29 LAB — LIPID PANEL
CHOLESTEROL: 184 mg/dL (ref 125–200)
HDL: 53 mg/dL (ref >=40)
LDL CALC: 112 mg/dL (ref <130)
TRIGLYCERIDES: 95 mg/dL (ref <150)
Total CHOL/HDL Ratio: 3.5 Ratio (ref <=5.0)
VLDL: 19 mg/dL (ref <30)

## 2016-09-29 LAB — CBC WITH DIFFERENTIAL/PLATELET
Basophils Absolute: 44 cells/uL (ref 0–200)
Basophils Relative: 1 %
EOS PCT: 2 %
Eosinophils Absolute: 88 cells/uL (ref 15–500)
HEMATOCRIT: 42.7 % (ref 38.5–50.0)
HEMOGLOBIN: 14.6 g/dL (ref 13.2–17.1)
LYMPHS ABS: 1408 {cells}/uL (ref 850–3900)
Lymphocytes Relative: 32 %
MCH: 32.1 pg (ref 27.0–33.0)
MCHC: 34.2 g/dL (ref 32.0–36.0)
MCV: 93.8 fL (ref 80.0–100.0)
MONO ABS: 396 {cells}/uL (ref 200–950)
MPV: 11 fL (ref 7.5–12.5)
Monocytes Relative: 9 %
NEUTROS ABS: 2464 {cells}/uL (ref 1500–7800)
NEUTROS PCT: 56 %
Platelets: 248 10*3/uL (ref 140–400)
RBC: 4.55 MIL/uL (ref 4.20–5.80)
RDW: 13 % (ref 11.0–15.0)
WBC: 4.4 10*3/uL (ref 4.0–10.5)

## 2016-09-29 LAB — COMPREHENSIVE METABOLIC PANEL
ALBUMIN: 4.4 g/dL (ref 3.6–5.1)
ALK PHOS: 57 U/L (ref 40–115)
ALT: 15 U/L (ref 9–46)
AST: 18 U/L (ref 10–40)
BILIRUBIN TOTAL: 0.7 mg/dL (ref 0.2–1.2)
BUN: 19 mg/dL (ref 7–25)
CALCIUM: 9.6 mg/dL (ref 8.6–10.3)
CO2: 28 mmol/L (ref 20–31)
CREATININE: 1.09 mg/dL (ref 0.60–1.35)
Chloride: 103 mmol/L (ref 98–110)
Glucose, Bld: 93 mg/dL (ref 65–99)
Potassium: 4.4 mmol/L (ref 3.5–5.3)
SODIUM: 139 mmol/L (ref 135–146)
TOTAL PROTEIN: 7 g/dL (ref 6.1–8.1)

## 2016-09-29 NOTE — Patient Instructions (Signed)
Use Flonase regularly and if that doesn't work switch to Rhinocort and if that doesn't work me know

## 2016-09-29 NOTE — Progress Notes (Signed)
   Subjective:    Patient ID: Jerry Powers, male    DOB: 1976/12/11, 40 y.o.   MRN: [id number]  HPI He is here for a complete examination. He does have underlying allergies and mainly having difficulty with nasal congestion. He does note increased difficulty when he is in his house. He has tried antihistamines and Flonase in the past. He also has a history of Raynaud's disease however can stand was very well by keeping his hands warm. He does run and has no trouble as long as he protects his hands. He has a history of these labialis and takes care of this appropriately. His work and home life are going well and he is expecting their first child. He has no other concerns or complaints. Family and social history as well as health maintenance and immunizations were reviewed.   Review of Systems  All other systems reviewed and are negative.      Objective:   Physical Exam BP 110/70   Pulse (!) 50   Ht 6' 0.5" (1.842 m)   Wt 207 lb (93.9 kg)   BMI 27.69 kg/m   General Appearance:    Alert, cooperative, no distress, appears stated age  Head:    Normocephalic, without obvious abnormality, atraumatic  Eyes:    PERRL, conjunctiva/corneas clear, EOM's intact, fundi    benign  Ears:    Normal TM's and external ear canals  Nose:   Nares normal, mucosa normal, no drainage or sinus   tenderness  Throat:   Lips, mucosa, and tongue normal; teeth and gums normal  Neck:   Supple, no lymphadenopathy;  thyroid:  no   enlargement/tenderness/nodules; no carotid   bruit or JVD     Lungs:     Clear to auscultation bilaterally without wheezes, rales or     ronchi; respirations unlabored      Heart:    Regular rate and rhythm, S1 and S2 normal, no murmur, rub   or gallop     Abdomen:     Soft, non-tender, nondistended, normoactive bowel sounds,    no masses, no hepatosplenomegaly  Genitalia:    Normal male external genitalia without lesions.  Testicles without masses.  No inguinal hernias.  Rectal:    Deferred. Stool cards given.   Extremities:   No clubbing, cyanosis or edema  Pulses:   2+ and symmetric all extremities  Skin:   Skin color, texture, turgor normal, no rashes or lesions  Lymph nodes:   Cervical, supraclavicular, and axillary nodes normal  Neurologic:   CNII-XII intact, normal strength, sensation and gait; reflexes 2+ and symmetric throughout          Psych:   Normal mood, affect, hygiene and grooming.          Assessment & Plan:  Routine general medical examination at a health care facility - Plan: CBC with Differential/Platelet, Comprehensive metabolic panel, Lipid panel, VITAMIN D 25 Hydroxy (Vit-D Deficiency, Fractures), POCT Urinalysis Dipstick  History of herpes labialis  Raynaud's disease without gangrene  Chronic non-seasonal allergic rhinitis, unspecified trigger Recommend he use Rhinocort and possibly a decongestant. He is to check with pharmacist concerning the appropriate one to use. If continued difficulty he is to call me for a different nasal steroid. He can use Claritin as needed for sneezing, itchy watery eyes. He is taking care of his Raynaud's disease appropriately.

## 2016-09-30 LAB — VITAMIN D 25 HYDROXY (VIT D DEFICIENCY, FRACTURES): Vit D, 25-Hydroxy: 56 ng/mL (ref 30–100)

## 2017-01-16 ENCOUNTER — Telehealth: Payer: Self-pay | Admitting: Family Medicine

## 2017-01-16 MED ORDER — OSELTAMIVIR PHOSPHATE 75 MG PO CAPS: 75.0000 mg | ORAL_CAPSULE | Freq: Every day | ORAL | 0 refills | Status: DC

## 2017-01-16 NOTE — Telephone Encounter (Signed)
Pt notified of Tamiflu & advised good hand washing techniques and house cleaning.

## 2017-01-16 NOTE — Telephone Encounter (Signed)
Pt states has 51 week old new born and his brother was over there yesterday and brother was just diagnosed with flu and pt wants to know about getting Tamiflu for himself because he has the newborn.  [facility]

## 2017-01-16 NOTE — Telephone Encounter (Signed)
Let him know that I called it in and recommend good handwashing. If it looks like he is getting sick, he needs to stay away from his kid

## 2017-01-17 NOTE — Telephone Encounter (Signed)
Patient informed. 

## 2017-05-12 ENCOUNTER — Telehealth: Payer: Self-pay | Admitting: Family Medicine

## 2017-05-12 NOTE — Telephone Encounter (Signed)
Pt has chest congestion, cough with yellow mucus, stuffy nose, headache. Cough and  Chest congestion is the most annoying. Pt has been using the [employer] script that he was given previously as needed when cold like this occur but is now out. Requesting refill or does he need to be seen?

## 2017-05-12 NOTE — Telephone Encounter (Signed)
Go ahead and call in the [facility]. Later know that if he doesn't improve through the weekend, let me see him next week

## 2017-05-15 ENCOUNTER — Ambulatory Visit (INDEPENDENT_AMBULATORY_CARE_PROVIDER_SITE_OTHER): Payer: 59 | Admitting: Family Medicine

## 2017-05-15 ENCOUNTER — Encounter: Payer: Self-pay | Admitting: Family Medicine

## 2017-05-15 VITALS — BP 110/60 | HR 68 | Temp 98.3°F | Resp 18 | Ht 72.0 in | Wt 210.6 lb

## 2017-05-15 DIAGNOSIS — J209 Acute bronchitis, unspecified: Secondary | ICD-10-CM | POA: Diagnosis not present

## 2017-05-15 MED ORDER — CLARITHROMYCIN 500 MG PO TABS: 500.0000 mg | ORAL_TABLET | Freq: Two times a day (BID) | ORAL | 0 refills | Status: DC

## 2017-05-15 MED ORDER — BENZONATATE 100 MG PO CAPS: 200.0000 mg | ORAL_CAPSULE | Freq: Three times a day (TID) | ORAL | 0 refills | Status: DC | PRN

## 2017-05-15 NOTE — Telephone Encounter (Signed)
Saw note today, I was out of office Friday.  Pt being seen today.

## 2017-05-15 NOTE — Progress Notes (Signed)
   Subjective:    Patient ID: Jerry Powers, male    DOB: 29-Oct-1976, 41 y.o.   MRN: [id number]  HPI He has been having intermittent difficulty over the last several months with URI symptoms. The most recent episode started approximately 2 weeks ago with sore throat, nasal congestion, rhinorrhea. He slowly got over that but again developed increased difficulty with the above symptoms as well as a hoarse voice. Cough has been worse since Friday. No fever, chills, earache. He does not smoke.   Review of Systems     Objective:   Physical Exam Alert and in no distress. Tympanic membranes and canals are normal. Pharyngeal area is normal. Neck is supple without adenopathy or thyromegaly. Cardiac exam shows a regular sinus rhythm without murmurs or gallops. Lungs are clear to auscultation.        Assessment & Plan:  Acute bronchitis, unspecified organism - Plan: clarithromycin (BIAXIN) 500 MG tablet, benzonatate (TESSALON) 100 MG capsule Since he has had intermittent difficulty, treatment is appropriate. He'll call if not entirely better when he finishes.

## 2017-06-22 ENCOUNTER — Telehealth: Payer: Self-pay | Admitting: Family Medicine

## 2017-06-22 ENCOUNTER — Other Ambulatory Visit: Payer: Self-pay

## 2017-06-22 DIAGNOSIS — J209 Acute bronchitis, unspecified: Secondary | ICD-10-CM

## 2017-06-22 MED ORDER — BENZONATATE 100 MG PO CAPS: 200.0000 mg | ORAL_CAPSULE | Freq: Three times a day (TID) | ORAL | 0 refills | Status: DC | PRN

## 2017-06-22 NOTE — Telephone Encounter (Signed)
ok 

## 2017-06-22 NOTE — Telephone Encounter (Signed)
Called in tessalon pearls per [employer]

## 2017-06-22 NOTE — Telephone Encounter (Signed)
Pt is out of town and left his Jerry Powers at homes. Can Dr Redmond School call in a script for this to the pharmacy near him at [facility] at [place], [place], [place] [address]?

## 2017-08-18 ENCOUNTER — Ambulatory Visit (INDEPENDENT_AMBULATORY_CARE_PROVIDER_SITE_OTHER): Payer: 59 | Admitting: Medical

## 2017-08-18 VITALS — BP 124/72 | HR 64 | Temp 98.3°F | Wt 207.8 lb

## 2017-08-18 DIAGNOSIS — J029 Acute pharyngitis, unspecified: Secondary | ICD-10-CM | POA: Diagnosis not present

## 2017-08-18 LAB — POCT RAPID STREP A (OFFICE): RAPID STREP A SCREEN: NEGATIVE

## 2017-08-18 MED ORDER — AZITHROMYCIN 500 MG PO TABS: 500.0000 mg | ORAL_TABLET | Freq: Every day | ORAL | 0 refills | Status: DC

## 2017-08-18 NOTE — Progress Notes (Signed)
  Subjective: Jerry Powers is a 41 y.o. male who presents for evaluation of sore throat.   Last night sore throat, fever, back pain, neck pain.  Took acetaminophen q4 hours last night.  This helped some with aches, sore throat.   Back of throat inflamed, white pockets in back of throat.   Works at [employer] at [employer].  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   No known mono exposures.   Has 67modaughter, wants to try and keep her from catching this.   Does have hx/o sinusitis in the past, but no current head congestion, sinus pressure.   Patient is not a smoker.  No other aggravating or relieving factors.  No other c/o.  The following portions of the patient's history were reviewed and updated as appropriate: allergies, current medications, past medical history, past social history, past surgical history and problem list.  No past medical history on file.  ROS as in subjective    Objective: BP 124/72   Pulse 64   Temp 98.3 F (36.8 C)   Wt 207 lb 12.8 oz (94.3 kg)   SpO2 98%   BMI 28.18 kg/m   General appearance: no distress, WD/WN, somewhat ill-appearing HEENT: normocephalic, conjunctiva/corneas normal, sclerae anicteric, nares patent, no discharge or erythema, pharynx with erythema, white exudate bilat Oral cavity: MMM, no lesions  Neck: supple, shoddy anterior, no thyromegaly Lungs: CTA bilaterally, no wheezes, rhonchi, or rales   Laboratory Strep test done. Results:negative.    Assessment: Encounter Diagnosis  Name Primary?  . Sore throat Yes    Plan: Despite negative swab which I presume is falsely negative, clinically appears to have strep and he meets 4 centor criteria.  Medications prescribed today: Azithromycin.  Advised that sore throat etiology appears to be bacterial.  Discussed symptoms, diagnosis, and possible complications including peritonsillar abscess formation.  Advised that they will be infectious for 24  hours after starting antibiotics.  Discussed means of prevention, precautions.  Supportive care recommended including OTC analgesics, salt water gargles, warm fluids, good hydration, and rest.  Discussed signs or symptoms that would prompt immediate evaluation.   Call or return if worse or not improving in the next 2-3 days. Patient voiced understanding of diagnosis, recommendations, and treatment plan.  [place] seen today for sore throat.  Diagnoses and all orders for this visit:  Sore throat -     Rapid Strep A  Other orders -     azithromycin (ZITHROMAX) 500 MG tablet; Take 1 tablet (500 mg total) by mouth daily.

## 2017-08-21 ENCOUNTER — Telehealth: Payer: Self-pay | Admitting: Family Medicine

## 2017-08-21 ENCOUNTER — Other Ambulatory Visit: Payer: Self-pay | Admitting: Medical

## 2017-08-21 MED ORDER — PREDNISONE 10 MG PO TABS: ORAL_TABLET | ORAL | 0 refills | Status: DC

## 2017-08-21 MED ORDER — CLARITHROMYCIN 500 MG PO TABS: 500.0000 mg | ORAL_TABLET | Freq: Two times a day (BID) | ORAL | 0 refills | Status: DC

## 2017-08-21 NOTE — Telephone Encounter (Signed)
Pt called and states that his throat Is still hurting, burning, and he has the white bumps on the back of his throat are still back there, he has taking all the antibiotic, he is wanting to know if you will send him in a different antibiotic in, other that than the throat he feels better, he can be reached at [phone] [id number] [address]

## 2017-08-21 NOTE — Telephone Encounter (Signed)
Let him know I feel bad he isn't seeing improvement.   I sent Biaxin antibiotic and a round of Prednisone/Steroid which should calm down the throat inflammation and symptoms.    Continue salt water gargles, warm fluids, chloraseptic spray.  Don't take Ibuprofen or other anti-inflammatory while on prednisone.    Take prednisone in the morning as it can sometime interfere with sleep.  Have him call back with symptoms update in 3-4 days.

## 2017-08-21 NOTE — Telephone Encounter (Signed)
Called and spoke with about this, he said that he didn't want to do the prednisone because in the past when he has taken it made him feeling bad. He really didn't want to take it again.He will start the biaxin.

## 2017-11-10 ENCOUNTER — Ambulatory Visit (INDEPENDENT_AMBULATORY_CARE_PROVIDER_SITE_OTHER): Payer: 59 | Admitting: Medical

## 2017-11-10 ENCOUNTER — Encounter: Payer: Self-pay | Admitting: Medical

## 2017-11-10 ENCOUNTER — Telehealth: Payer: Self-pay | Admitting: Medical

## 2017-11-10 VITALS — BP 108/72 | HR 63 | Temp 97.4°F | Wt 209.0 lb

## 2017-11-10 DIAGNOSIS — J329 Chronic sinusitis, unspecified: Secondary | ICD-10-CM | POA: Diagnosis not present

## 2017-11-10 DIAGNOSIS — R059 Cough, unspecified: Secondary | ICD-10-CM

## 2017-11-10 DIAGNOSIS — R0689 Other abnormalities of breathing: Secondary | ICD-10-CM

## 2017-11-10 DIAGNOSIS — R05 Cough: Secondary | ICD-10-CM

## 2017-11-10 MED ORDER — MOMETASONE FURO-FORMOTEROL FUM 100-5 MCG/ACT IN AERO: 2.0000 | INHALATION_SPRAY | Freq: Two times a day (BID) | RESPIRATORY_TRACT | 0 refills | Status: DC

## 2017-11-10 MED ORDER — CLARITHROMYCIN 500 MG PO TABS: 500.0000 mg | ORAL_TABLET | Freq: Two times a day (BID) | ORAL | 0 refills | Status: DC

## 2017-11-10 MED ORDER — BENZONATATE 200 MG PO CAPS: 200.0000 mg | ORAL_CAPSULE | Freq: Three times a day (TID) | ORAL | 0 refills | Status: DC | PRN

## 2017-11-10 NOTE — Telephone Encounter (Signed)
Per shane it okay to change this, [facility]  Change to 172m # 60

## 2017-11-10 NOTE — Progress Notes (Signed)
Subjective: Chief Complaint  Patient presents with  . coughing, sinus    coughing, sinus,yellow , no fever  mucus x 1 week    He notes 6-7 days ago with cold symptoms, sore throat, congestion, taking OTC medication.  Taking sudafed in morning, benadryl QHS, left over tessalon Perles.   Lost voice earlier this week.   This occurred on an off.  Has had some persistent sinus pain.   Has progressively gotten worse.  He has cough the last few night, not sleeping well given the cough.  Wants refill on Tessalon Perles.   No other aggravating or relieving factors. No other complaint.  No past medical history on file.  Current Outpatient Medications on File Prior to Visit  Medication Sig Dispense Refill  . ibuprofen (ADVIL,MOTRIN) 200 MG tablet Take 200 mg by mouth every 6 (six) hours as needed.    . loratadine (CLARITIN) 10 MG tablet Take 10 mg by mouth daily.    . Multiple Vitamins-Minerals (MULTIVITAMIN WITH MINERALS) tablet Take 1 tablet by mouth daily.    . Probiotic Product (PROBIOTIC-10 PO) Take by mouth.     No current facility-administered medications on file prior to visit.    ROS as in subjective   Objective: BP 108/72   Pulse 63   Temp (!) 97.4 F (36.3 C)   Wt 209 lb (94.8 kg)   SpO2 98%   BMI 28.35 kg/m   General appearance: Alert, WD/WN, no distress                             Skin: warm, no rash, no diaphoresis                           Head: frontal+ sinus tenderness                            Eyes: conjunctiva normal, corneas clear, PERRLA                            Ears: pearly TMs, external ear canals normal                          Nose: septum midline, turbinates swollen, with erythema and mucoid discharge, not much patency right nostril             Mouth/throat: MMM, tongue normal, mild pharyngeal erythema                           Neck: supple, no adenopathy, no thyromegaly, non tender                          Heart: RRR, normal S1, S2, no murmurs            Lungs: +bronchial breath sounds, no rhonchi, no wheezes, no rales                Extremities: no edema, non tender       Assessment: Encounter Diagnoses  Name Primary?  . Sinusitis, unspecified chronicity, unspecified location Yes  . Cough   . Decreased lung sounds      Plan: PFT normal.  Discussed symptoms, discussed treatment recommendations as below.  Gave sample of Dulera to  see if this helps his cough and symptoms as well.  He has hx/o "bronchitis" typically once yearly.  Lung fields muffled today in general.  Recommendations:  Hydrate well with water  You can do nasal saline flush  You can use Afrin nasal for 4 days or less to help open nasal passages  You can use the Tessalon Perles for cough up to 3 times daily  You can use salt water gargles and warm fluids such as hot tea to sooth throat and clear mucous from the throat  Consider Mucinex DM for cough and congestion for the next 4-5 days  I sent another round of Biaxin that we used in September for similar respiratory infection  Jerry Powers was seen today for coughing, sinus.  Diagnoses and all orders for this visit:  Sinusitis, unspecified chronicity, unspecified location  Cough  Decreased lung sounds -     Spirometry with Graph  Other orders -     benzonatate (TESSALON) 200 MG capsule; Take 1 capsule (200 mg total) by mouth 3 (three) times daily as needed for cough. -     clarithromycin (BIAXIN) 500 MG tablet; Take 1 tablet (500 mg total) by mouth 2 (two) times daily. -     mometasone-formoterol (DULERA) 100-5 MCG/ACT AERO; Inhale 2 puffs into the lungs 2 (two) times daily.

## 2017-11-10 NOTE — Telephone Encounter (Signed)
Pt called and stated that the tessalon pearls 220m that were sent in today are $40.00. 100 mg of the same medication is $3.34. He would like rx changed to 100 mg number number 60 instead.

## 2017-11-10 NOTE — Patient Instructions (Addendum)
Recommendations:  Hydrate well with water  You can do nasal saline flush  You can use Afrin nasal for 4 days or less to help open nasal passages  You can use the Tessalon Perles for cough up to 3 times daily  You can use salt water gargles and warm fluids such as hot tea to sooth throat and clear mucous from the throat  Consider Mucinex DM for cough and congestion for the next 4-5 days  I sent another round of Biaxin that we used in September for similar respiratory infection     Using Saline Nose Drops with Bulb Syringe A bulb syringe is used to clear your nose. You may use it when you have a stuffy nose, nasal congestion, sinus pressure, or sneezing.   SALINE SOLUTION You can buy nose drops at your local drug store. You can also make nose drops yourself. Mix 1 cup of water with  teaspoon of salt. Stir. Store this mixture at room temperature. Make a new batch daily.  USE THE BULB IN COMBINATION WITH SALINE NOSE DROPS  Squeeze the air out of the bulb before suctioning the saline mixture.  While still squeezing the bulb flat, place the tip of the bulb into the saline mixture.  Let air come back into the bulb.  This will suction up the saline mixture.  Gently flush one nostril at a time.  Salt water nose drops will then moisten your  congested nose and loosen secretions before suctioning.  Use the bulb syringe as directed below to suction.  USING THE BULB SYRINGE TO SUCTION  While still squeezing the bulb flat, place the tip of the bulb into a nostril. Let air come back into the bulb. The suction will pull snot out of the nose and into the bulb.  Repeat on the other nostril.  Squeeze syringe several times into a tissue.  CLEANING THE BULB SYRINGE  Clean the bulb syringe every day with hot soapy water.  Clean the inside of the bulb by squeezing the bulb while the tip is in soapy water.  Rinse by squeezing the bulb while the tip is in clean hot water.  Store the bulb  with the tip side down on paper towel.  HOME CARE INSTRUCTIONS   Use saline nose drops often to keep the nose open and not stuffy.  Throw away used salt water. Make a new solution every time.  Do not use the same solution and dropper for another person  If you do not prefer to use nasal saline flush, other options include nasal saline spray or the [employer], both of which are available over the counter at your pharmacy.

## 2017-11-28 ENCOUNTER — Ambulatory Visit (INDEPENDENT_AMBULATORY_CARE_PROVIDER_SITE_OTHER): Payer: 59 | Admitting: Family Medicine

## 2017-11-28 ENCOUNTER — Encounter: Payer: Self-pay | Admitting: Family Medicine

## 2017-11-28 ENCOUNTER — Telehealth: Payer: Self-pay | Admitting: Family Medicine

## 2017-11-28 VITALS — BP 110/62 | HR 61 | Temp 97.8°F | Ht 72.0 in | Wt 207.0 lb

## 2017-11-28 DIAGNOSIS — J329 Chronic sinusitis, unspecified: Secondary | ICD-10-CM | POA: Diagnosis not present

## 2017-11-28 MED ORDER — BENZONATATE 100 MG PO CAPS: 100.0000 mg | ORAL_CAPSULE | Freq: Three times a day (TID) | ORAL | 0 refills | Status: DC | PRN

## 2017-11-28 MED ORDER — CLARITHROMYCIN 500 MG PO TABS: 500.0000 mg | ORAL_TABLET | Freq: Two times a day (BID) | ORAL | 0 refills | Status: DC

## 2017-11-28 MED ORDER — LEVOFLOXACIN 500 MG PO TABS
500.0000 mg | ORAL_TABLET | Freq: Every day | ORAL | 0 refills | Status: DC
Start: 2017-11-28 — End: 2017-11-28

## 2017-11-28 NOTE — Progress Notes (Signed)
   Subjective:    Patient ID: Jerry Powers, male    DOB: [birth year], 41 y.o.   MRN: [id number]  HPI He is here for consult concerning a 9-day history that started with cough, sinus pressure and pain, PND.  He was exposed to a virus from his wife and son prior to this.  In the last several days the cough has gotten worse as well as a sinus pressure and pain.  Apparently Tessalon Perles usually work for control of the cough but have not been is successful.  He has had no fever, chills, sore throat or earache.  He does not smoke.   Review of Systems     Objective:   Physical Exam Alert and in no distress.  Nasal mucosa is red with tenderness over frontal sinuses tympanic membranes and canals are normal. Pharyngeal area is normal. Neck is supple without adenopathy or thyromegaly. Cardiac exam shows a regular sinus rhythm without murmurs or gallops. Lungs are clear to auscultation.       Assessment & Plan:  Sinusitis, unspecified chronicity, unspecified location - Plan: levofloxacin (LEVAQUIN) 500 MG tablet, benzonatate (TESSALON) 100 MG capsule I decided to switch him to Levaquin as he recently did finish another course of a different antibiotic. He will call if not entirely better when he finishes the antibiotic.

## 2017-11-28 NOTE — Telephone Encounter (Signed)
He is about to have a procedure done on his tendons and as such I will have him switch to Biaxin instead of the Levaquin to minimize any possible adverse effects from the quinolone.

## 2017-11-28 NOTE — Telephone Encounter (Signed)
Pt called and is wanting states that the Levaquin antibioitc  causes tendon issues and he is having tendon surgery in mid January and he is wanting to know if this is still ok to take, he did not get the medicine at the pharmacy he told them to hold it until he talked to you, he is not for sure if you want him to take something else like Biaxin or a Z-pak, if the Levaquin if not good for him. [id number] [place] [place] at [phone]

## 2018-03-28 ENCOUNTER — Ambulatory Visit (INDEPENDENT_AMBULATORY_CARE_PROVIDER_SITE_OTHER): Payer: 59 | Admitting: Family Medicine

## 2018-03-28 ENCOUNTER — Encounter: Payer: Self-pay | Admitting: Family Medicine

## 2018-03-28 VITALS — BP 110/70 | HR 82 | Temp 98.0°F | Ht 71.5 in | Wt 211.2 lb

## 2018-03-28 DIAGNOSIS — J020 Streptococcal pharyngitis: Secondary | ICD-10-CM

## 2018-03-28 LAB — POCT RAPID STREP A (OFFICE): RAPID STREP A SCREEN: POSITIVE — AB

## 2018-03-28 MED ORDER — CLARITHROMYCIN 500 MG PO TABS: 500.0000 mg | ORAL_TABLET | Freq: Two times a day (BID) | ORAL | 0 refills | Status: DC

## 2018-03-28 NOTE — Progress Notes (Signed)
   Subjective:    Patient ID: Jerry Powers, male    DOB: February 07, 1976, 42 y.o.   MRN: [id number]  HPI He complains of a one day history that started with headache, sore throat, fever, chills with malaise.  He has a previous history of strep and states that it feels similar.   Review of Systems     Objective:   Physical Exam Alert and in no distress. Tympanic membranes and canals are normal. Pharyngeal area is slightly red with tonsils slightly swollen but no exudates.  Neck is supple without adenopathy or thyromegaly. Cardiac exam shows a regular sinus rhythm without murmurs or gallops. Lungs are clear to auscultation. Strep screen positive       Assessment & Plan:  Pharyngitis due to Streptococcus species - Plan: Rapid Strep A, clarithromycin (BIAXIN) 500 MG tablet

## 2018-04-01 ENCOUNTER — Other Ambulatory Visit: Payer: Self-pay

## 2018-04-01 ENCOUNTER — Emergency Department (HOSPITAL_COMMUNITY)
Admission: EM | Admit: 2018-04-01 | Discharge: 2018-04-01 | Disposition: A | Discharge status: Home / Self Care | Payer: 59 | Attending: Emergency Medicine | Admitting: Emergency Medicine

## 2018-04-01 ENCOUNTER — Encounter (HOSPITAL_COMMUNITY): Payer: Self-pay

## 2018-04-01 DIAGNOSIS — J020 Streptococcal pharyngitis: Secondary | ICD-10-CM | POA: Diagnosis not present

## 2018-04-01 DIAGNOSIS — R590 Localized enlarged lymph nodes: Secondary | ICD-10-CM | POA: Insufficient documentation

## 2018-04-01 DIAGNOSIS — Z79899 Other long term (current) drug therapy: Secondary | ICD-10-CM | POA: Diagnosis not present

## 2018-04-01 DIAGNOSIS — R070 Pain in throat: Secondary | ICD-10-CM | POA: Diagnosis present

## 2018-04-01 MED ORDER — CLINDAMYCIN HCL 150 MG PO CAPS: 300.0000 mg | ORAL_CAPSULE | Freq: Three times a day (TID) | ORAL | 0 refills | Status: AC

## 2018-04-01 MED ORDER — CLINDAMYCIN PHOSPHATE 300 MG/50ML IV SOLN
300.0000 mg | Freq: Once | INTRAVENOUS | Status: AC
  Administered 2018-04-01: 300 mg via INTRAVENOUS
  Filled 2018-04-01: qty 50

## 2018-04-01 MED ORDER — TRAMADOL HCL 50 MG PO TABS: 50.0000 mg | ORAL_TABLET | Freq: Four times a day (QID) | ORAL | 0 refills | Status: DC | PRN

## 2018-04-01 MED ORDER — SODIUM CHLORIDE 0.9 % IV BOLUS
1000.0000 mL | Freq: Once | INTRAVENOUS | Status: AC
  Administered 2018-04-01: 1000 mL via INTRAVENOUS

## 2018-04-01 MED ORDER — KETOROLAC TROMETHAMINE 30 MG/ML IJ SOLN
30.0000 mg | Freq: Once | INTRAMUSCULAR | Status: AC
  Administered 2018-04-01: 30 mg via INTRAVENOUS
  Filled 2018-04-01: qty 1

## 2018-04-01 NOTE — Discharge Instructions (Addendum)
Take the antibiotics as prescribed, follow-up with your primary doctor or consider seeing an ENT doctor if the symptoms persist

## 2018-04-01 NOTE — ED Provider Notes (Signed)
[facility] DEPT Provider Note   CSN: [id number] Arrival date & time: 04/01/18  1356     History   Chief Complaint Chief Complaint  Patient presents with  . R/o Peritonsilar Abscess    HPI Jerry Powers is a 42 y.o. male.  HPI Pt was diagnosed with strep throat earlier in the week.  He was started on abx.  Initially he started to feel better but Yesterday and today the pain was more severe and he started to feel sick.  He has soreness primarily on the right side and one lymph node is very tender. The pain in his throat is severe.  He tried otc meds without relief. Pt called his doctor who said he might have a PTA and he should come to the ED.  No vomiting.  No trouble handling secretions.  No muffled voice.   History reviewed. No pertinent past medical history.  Patient Active Problem List   Diagnosis Date Noted  . Raynaud's disease 10/15/2013  . 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 PES PLANUS 07/30/2009    History reviewed. No pertinent surgical history.      Home Medications    Prior to Admission medications   Medication Sig Start Date End Date Taking? Authorizing Provider  acetaminophen (TYLENOL) 500 MG tablet Take 1,000 mg by mouth every 6 (six) hours as needed for mild pain.   Yes [provider]  acyclovir (ZOVIRAX) 800 MG tablet Take 800 mg by mouth as needed for lip care. 03/17/18  Yes [provider]  benzonatate (TESSALON) 100 MG capsule Take 1 capsule (100 mg total) by mouth 3 (three) times daily as needed for cough. 11/28/17  Yes Denita Lung, MD  cetirizine (ZYRTEC) 10 MG tablet Take 10 mg by mouth daily.   Yes [provider]  Cholecalciferol (VITAMIN D) 2000 units CAPS Take 2,000 Units by mouth daily.   Yes [provider]  famotidine (PEPCID) 20 MG tablet Take 20 mg by mouth as needed for heartburn or indigestion.   Yes [provider]  ibuprofen (ADVIL,MOTRIN) 200 MG  tablet Take 600 mg by mouth every 6 (six) hours as needed for moderate pain.    Yes [provider]  loratadine (CLARITIN) 10 MG tablet Take 10 mg by mouth daily.   Yes [provider]  Multiple Vitamins-Minerals (MULTIVITAMIN WITH MINERALS) tablet Take 1 tablet by mouth daily.   Yes [provider]  naproxen sodium (ALEVE) 220 MG tablet Take 440 mg by mouth daily as needed (pain).   Yes [provider]  Probiotic Product (PROBIOTIC-10 PO) Take 1 tablet by mouth daily.    Yes [provider]  ranitidine (ZANTAC) 150 MG tablet Take 150 mg by mouth as needed for heartburn.   Yes [provider]  vitamin C (ASCORBIC ACID) 500 MG tablet Take 1,500 mg by mouth daily.   Yes [provider]  clindamycin (CLEOCIN) 150 MG capsule Take 2 capsules (300 mg total) by mouth 3 (three) times daily for 7 days. 04/01/18 04/08/18  Dorie Rank, MD  mometasone-formoterol (DULERA) 100-5 MCG/ACT AERO Inhale 2 puffs into the lungs 2 (two) times daily. Patient not taking: Reported on 03/28/2018 11/10/17   Tysinger, Camelia Eng, PA-C  traMADol (ULTRAM) 50 MG tablet Take 1 tablet (50 mg total) by mouth every 6 (six) hours as needed. 04/01/18   Dorie Rank, MD    Family History No family history on file.  Social History Social History  Tobacco Use  . Smoking status: Never Smoker  . Smokeless tobacco: Never Used  Substance Use Topics  . Alcohol use: Yes    Frequency: Never    Comment: rare  . Drug use: No     Allergies   Codeine; Penicillins; and Sulfonamide derivatives   Review of Systems Review of Systems  All other systems reviewed and are negative.    Physical Exam Updated Vital Signs BP 130/72 (BP Location: Right Arm)   Pulse 83   Temp 98.3 F (36.8 C) (Oral)   Resp 17   Ht 1.816 m (5' 11.5")   Wt 95.7 kg (211 lb)   SpO2 96%   BMI 29.02 kg/m   Physical Exam  Constitutional: He appears well-developed and well-nourished. No distress.    HENT:  Head: Normocephalic and atraumatic.  Right Ear: External ear normal.  Left Ear: External ear normal.  Mouth/Throat: Mucous membranes are normal. No oral lesions. No dental abscesses. Posterior oropharyngeal erythema present. No oropharyngeal exudate, posterior oropharyngeal edema or tonsillar abscesses.  Mild edema bilateral tonsils and uvula, no asymmetry, no peritonsillar fullness  Eyes: Conjunctivae are normal. Right eye exhibits no discharge. Left eye exhibits no discharge. No scleral icterus.  Neck: Neck supple. No tracheal deviation present.  Cardiovascular: Normal rate, regular rhythm and intact distal pulses.  Pulmonary/Chest: Effort normal and breath sounds normal. No stridor. No respiratory distress. He has no wheezes. He has no rales.  Abdominal: Soft. Bowel sounds are normal. He exhibits no distension. There is no tenderness. There is no rebound and no guarding.  Musculoskeletal: He exhibits no edema or tenderness.  Lymphadenopathy:    He has cervical adenopathy.       Right cervical: Superficial cervical adenopathy present.  Neurological: He is alert. He has normal strength. No cranial nerve deficit (no facial droop, extraocular movements intact, no slurred speech) or sensory deficit. He exhibits normal muscle tone. He displays no seizure activity. Coordination normal.  Skin: Skin is warm and dry. No rash noted.  Psychiatric: He has a normal mood and affect.  Nursing note and vitals reviewed.    ED Treatments / Results   Procedures Procedures (including critical care time)  Medications Ordered in ED Medications  ketorolac (TORADOL) 30 MG/ML injection 30 mg (30 mg Intravenous Given 04/01/18 1607)  sodium chloride 0.9 % bolus 1,000 mL (0 mLs Intravenous Stopped 04/01/18 1637)  clindamycin (CLEOCIN) IVPB 300 mg (0 mg Intravenous Stopped 04/01/18 1638)     Initial Impression / Assessment and Plan / ED Course  I have reviewed the triage vital signs and the nursing  notes.  Pertinent labs & imaging results that were available during my care of the patient were reviewed by me and considered in my medical decision making (see chart for details).   Patient's symptoms were certainly concerning for the possibility of a peritonsillar abscess however his physical exam does not suggest a PTA.  Patient has no significant swelling.  No asymmetry.  He does not have a muffled voice.  Possible sore throat is not responding to clarithromycin.  Patient was given a dose of Cleocin for possible persistent peritonsillar cellulitis.  I will give him a prescription for that.  Recommended follow-up with his primary doctor consider seeing ENT if his symptoms are not improving.  Final Clinical Impressions(s) / ED Diagnoses   Final diagnoses:  Pharyngitis due to Streptococcus species    ED Discharge Orders        Ordered    clindamycin (CLEOCIN)  150 MG capsule  3 times daily     04/01/18 1726    traMADol (ULTRAM) 50 MG tablet  Every 6 hours PRN     04/01/18 1726       Dorie Rank, MD 04/01/18 1727

## 2018-04-01 NOTE — ED Triage Notes (Signed)
Positive strep Wednesday.Clirthromycin abx being taken properly. Friday pain returned, increasing pain today. Right lymph node swollen today. Peritonsillar abscess per PCP.

## 2018-04-01 NOTE — ED Notes (Signed)
Bed: WA01 Expected date:  Expected time:  Means of arrival:  Comments:

## 2018-04-02 ENCOUNTER — Encounter: Payer: Self-pay | Admitting: Family Medicine

## 2018-07-25 ENCOUNTER — Other Ambulatory Visit (INDEPENDENT_AMBULATORY_CARE_PROVIDER_SITE_OTHER): Payer: BC Managed Care – PPO

## 2018-07-25 DIAGNOSIS — Z111 Encounter for screening for respiratory tuberculosis: Secondary | ICD-10-CM | POA: Diagnosis not present

## 2018-12-25 ENCOUNTER — Other Ambulatory Visit: Payer: Self-pay | Admitting: Family Medicine

## 2018-12-25 DIAGNOSIS — J329 Chronic sinusitis, unspecified: Secondary | ICD-10-CM

## 2018-12-25 NOTE — Telephone Encounter (Signed)
Called pt to see if he was still taking. [place]

## 2018-12-25 NOTE — Telephone Encounter (Signed)
[facility] is requesting to fill pt tessalon. Pt says he is now taking them for a cough . Please advise [facility]

## 2018-12-27 ENCOUNTER — Encounter: Payer: Self-pay | Admitting: Family Medicine

## 2018-12-27 ENCOUNTER — Ambulatory Visit: Payer: BC Managed Care – PPO | Admitting: Family Medicine

## 2018-12-27 VITALS — BP 110/60 | HR 61 | Temp 98.0°F | Resp 16 | Wt 218.0 lb

## 2018-12-27 DIAGNOSIS — J0140 Acute pansinusitis, unspecified: Secondary | ICD-10-CM

## 2018-12-27 MED ORDER — CLARITHROMYCIN 500 MG PO TABS: 500.0000 mg | ORAL_TABLET | Freq: Two times a day (BID) | ORAL | 0 refills | Status: DC

## 2018-12-27 NOTE — Progress Notes (Signed)
Subjective:  Jerry Powers is a 43 y.o. male who presents for a 10 day history of left ear discomfort, congestion, sinus pressure, post nasal drainage and cough. Cough is worse at night.  Apparently he has had multiple colds over the past few months and this seems to be more like how he feels with acute sinusitis.  He was prescribed Tessalon for cough but this has not helped.   Denies fever, chills, headache, sore throat, chest pain, palpitations, shortness of breath, wheezing, abdominal pain, N/VD.   Treatment to date: decongestants and Afrin. Mucinex.  Denies sick contacts.  No other aggravating or relieving factors.  No other c/o.  ROS as in subjective.   Objective: Vitals:   12/27/18 1553  BP: 110/60  Pulse: 61  Resp: 16  Temp: 98 F (36.7 C)  SpO2: 98%    General appearance: Alert, WD/WN, no distress, mildly ill appearing                             Skin: warm, no rash                           Head: + frontal and maxillary sinus tenderness                            Eyes: conjunctiva normal, corneas clear, PERRLA                            Ears: pearly TMs, external ear canals normal                          Nose: septum midline, turbinates swollen, with erythema and thick discharge             Mouth/throat: MMM, tongue normal, mild pharyngeal erythema, no edema or exudate                           Neck: supple, no adenopathy, no thyromegaly, nontender                          Heart: RRR, normal S1, S2, no murmurs                         Lungs: CTA bilaterally, no wheezes, rales, or rhonchi      Assessment: Acute pansinusitis, recurrence not specified - Plan: clarithromycin (BIAXIN) 500 MG tablet    Plan: Discussed diagnosis and treatment of acute sinusitis.  Biaxin prescribed due to patient's allergies to penicillin and sulfa.  Continue with symptomatic treatment.  Advised him to stop Afrin due to the potential for rebound congestion.  Follow-up if not back to  baseline after completing the antibiotic or if he is worsening.

## 2019-01-14 ENCOUNTER — Encounter: Payer: Self-pay | Admitting: Family Medicine

## 2019-01-15 ENCOUNTER — Telehealth: Payer: Self-pay | Admitting: Family Medicine

## 2019-01-15 MED ORDER — OSELTAMIVIR PHOSPHATE 75 MG PO CAPS: 75.0000 mg | ORAL_CAPSULE | Freq: Every day | ORAL | 0 refills | Status: DC

## 2019-01-15 NOTE — Telephone Encounter (Signed)
Let him know that I called the Tamiflu in

## 2019-01-15 NOTE — Telephone Encounter (Signed)
Pt called and said wife tested positive for the flu and his daughter is running a fever and he is on the way to take her to the dr now. Pt is wondering could he get tamiflu called in just in case. Pt said he went to urgent care Saturday but tested negative for flu

## 2019-01-16 NOTE — Telephone Encounter (Signed)
tamiflu was called in. Pt advised.  Was treated by [facility] for a sinus infection. Pt felt worse and went to urgent care and med were changed. Pt would like to know should he come in due to not feeling better since sundays visit to urgent care. Please advise . [place]

## 2019-01-18 ENCOUNTER — Ambulatory Visit: Payer: Self-pay | Admitting: Family Medicine

## 2019-01-21 ENCOUNTER — Ambulatory Visit: Payer: BC Managed Care – PPO | Admitting: Family Medicine

## 2019-01-21 ENCOUNTER — Encounter: Payer: Self-pay | Admitting: Family Medicine

## 2019-01-21 VITALS — BP 118/76 | HR 70 | Temp 98.7°F | Wt 216.6 lb

## 2019-01-21 DIAGNOSIS — J209 Acute bronchitis, unspecified: Secondary | ICD-10-CM

## 2019-01-21 DIAGNOSIS — J329 Chronic sinusitis, unspecified: Secondary | ICD-10-CM

## 2019-01-21 MED ORDER — BENZONATATE 100 MG PO CAPS: 200.0000 mg | ORAL_CAPSULE | Freq: Three times a day (TID) | ORAL | 0 refills | Status: DC | PRN

## 2019-01-21 NOTE — Progress Notes (Signed)
   Subjective:    Patient ID: Jerry Powers, male    DOB: 1976-03-09, 43 y.o.   MRN: [id number]  HPI He is here for a recheck.  He was seen January 16 and treated with Biaxin and then again on February 2.  He was switched at that point to Ceftin and a prednisone Dosepak however he only took a 6-day course.  He is still having difficulty with cough and occasional wheezing.  During that same timeframe he is exposed to the flu and is now finishing up on Tamiflu.  No fever, chills, sore throat.   Review of Systems     Objective:   Physical Exam Alert and in no distress. Tympanic membranes and canals are normal. Pharyngeal area is normal. Neck is supple without adenopathy or thyromegaly. Cardiac exam shows a regular sinus rhythm without murmurs or gallops. Lungs are clear to auscultation.        Assessment & Plan:  Acute bronchitis, unspecified organism  Sinusitis, unspecified chronicity, unspecified location - Plan: benzonatate (TESSALON) 100 MG capsule After discussion with him, he will refill the cephalosporin and take another week of that.  He is to call me if not entirely better when he finishes.  I will also give him Tessalon perles.

## 2019-02-03 ENCOUNTER — Other Ambulatory Visit: Payer: Self-pay | Admitting: Family Medicine

## 2019-02-03 MED ORDER — OSELTAMIVIR PHOSPHATE 75 MG PO CAPS: 75.0000 mg | ORAL_CAPSULE | Freq: Two times a day (BID) | ORAL | 0 refills | Status: DC

## 2019-02-03 NOTE — Progress Notes (Signed)
He called stating he is again having flulike symptoms with fever and aches and would like Tamiflu again.  I will call it in

## 2019-02-05 ENCOUNTER — Ambulatory Visit: Payer: BC Managed Care – PPO | Admitting: Family Medicine

## 2019-02-05 ENCOUNTER — Encounter: Payer: Self-pay | Admitting: Family Medicine

## 2019-02-05 VITALS — BP 124/82 | HR 73 | Temp 98.2°F | Wt 220.0 lb

## 2019-02-05 DIAGNOSIS — J029 Acute pharyngitis, unspecified: Secondary | ICD-10-CM

## 2019-02-05 NOTE — Progress Notes (Signed)
   Subjective:    Patient ID: Jerry Powers, male    DOB: Feb 13, 1976, 43 y.o.   MRN: [id number]  HPI Last Sunday he developed flulike symptoms and I called in Tamiflu.  He did not have a cough with those symptoms however.  Today he complains of a 1 day history of a very sore throat and difficulty swallowing but no fever, chills, cough or congestion.   Review of Systems     Objective:   Physical Exam Alert and in no distress.  TMs are clear.  Throat shows slight pharyngeal erythema.  Neck is supple without adenopathy.  Strep screen is negative.       Assessment & Plan:  Viral pharyngitis Recommend supportive care and if no improvement in 1 week, call me.

## 2019-02-06 ENCOUNTER — Telehealth: Payer: Self-pay

## 2019-02-06 ENCOUNTER — Telehealth: Payer: Self-pay | Admitting: Family Medicine

## 2019-02-06 NOTE — Telephone Encounter (Signed)
Per JCL called in xylocaine for sore throat. [place]

## 2019-02-06 NOTE — Telephone Encounter (Signed)
LVM for pt . [place]

## 2019-02-06 NOTE — Telephone Encounter (Signed)
Pt was in the office yesterday afternoon. Called this morning and stated that his daughter was diagnosed with hand foot and mouth disease. He woke up with spots on his hand and mouth and thinks that's what he has. Pt wants to know if he needs to come back in for a visit or if there is something else he should do?

## 2019-02-06 NOTE — Telephone Encounter (Signed)
There is really nothing that needs to be done other than symptom relief.  It is a viral infection.

## 2019-02-06 NOTE — Telephone Encounter (Signed)
Pt would like to know should he be worried about a immune disorder due to pt daughter having hand, foot and mouth. Also his concern is he is still sick after being on abx for three rounds.  Pt would like to know should he have labs drawn to out rule hand foot and mouth due to sore throat , lesion on hands and by mouth. Please advise [facility]

## 2019-02-27 ENCOUNTER — Telehealth: Payer: Self-pay

## 2019-02-27 MED ORDER — CEFDINIR 300 MG PO CAPS: 300.0000 mg | ORAL_CAPSULE | Freq: Two times a day (BID) | ORAL | 0 refills | Status: DC

## 2019-02-27 NOTE — Telephone Encounter (Signed)
Patient stated that he has been treated for a sinus infection several times around this time of the year and wants to know if something can be sent to the pharmacy for him. Please advise.

## 2019-02-27 NOTE — Telephone Encounter (Signed)
I called in an antibiotic

## 2019-02-27 NOTE — Telephone Encounter (Signed)
lvm for pt . [place]

## 2019-08-29 ENCOUNTER — Other Ambulatory Visit: Payer: Self-pay

## 2019-08-29 DIAGNOSIS — Z20822 Contact with and (suspected) exposure to covid-19: Secondary | ICD-10-CM

## 2019-08-31 LAB — NOVEL CORONAVIRUS, NAA: SARS-CoV-2, NAA: NOT DETECTED

## 2019-12-26 ENCOUNTER — Other Ambulatory Visit: Payer: Self-pay | Admitting: Family Medicine

## 2019-12-26 DIAGNOSIS — J329 Chronic sinusitis, unspecified: Secondary | ICD-10-CM

## 2019-12-26 NOTE — Telephone Encounter (Signed)
Is this okay to refill? 

## 2020-01-02 ENCOUNTER — Ambulatory Visit: Payer: BC Managed Care – PPO | Admitting: Family Medicine

## 2020-01-02 ENCOUNTER — Other Ambulatory Visit: Payer: Self-pay

## 2020-01-02 ENCOUNTER — Telehealth: Payer: Self-pay | Admitting: Family Medicine

## 2020-01-02 ENCOUNTER — Encounter: Payer: Self-pay | Admitting: Family Medicine

## 2020-01-02 VITALS — Temp 97.6°F | Wt 202.0 lb

## 2020-01-02 DIAGNOSIS — J329 Chronic sinusitis, unspecified: Secondary | ICD-10-CM

## 2020-01-02 MED ORDER — CEFDINIR 300 MG PO CAPS: 300.0000 mg | ORAL_CAPSULE | Freq: Two times a day (BID) | ORAL | 0 refills | Status: DC

## 2020-01-02 NOTE — Telephone Encounter (Signed)
Error

## 2020-01-02 NOTE — Progress Notes (Signed)
   Subjective:    Patient ID: Jerry Powers, male    DOB: 02/05/1976, 44 y.o.   MRN: [id number]  HPI Documentation for virtual telephone encounter. Documentation for virtual audio and video telecommunications through [place] encounter:  The patient was located at home. The provider was located in the office. The patient did consent to this visit and is aware of possible charges through their insurance for this visit. The other persons participating in this telemedicine service were none. This virtual service is not related to other E/M service within previous 7 days. He has had difficulty over the last 3 weeks with sinus congestion, dry cough, PND and now frontal sinus headache.  He was tested for Covid last Friday and the test was negative.  He was given prednisone which  has never helped.  No fever, chills, myalgias, malaise.   Review of Systems     Objective:   Physical Exam Alert and in no distress.  Points to the frontal sinus areas to where the most pain is.      Assessment & Plan:  Sinusitis, unspecified chronicity, unspecified location - Plan: cefdinir (OMNICEF) 300 MG capsule In the past he had tried clarithromycin without much success and Omnicef worked better.  I will call that in for him.  He was comfortable with that.

## 2020-04-07 ENCOUNTER — Other Ambulatory Visit: Payer: Self-pay | Admitting: Family Medicine

## 2020-04-07 DIAGNOSIS — J329 Chronic sinusitis, unspecified: Secondary | ICD-10-CM

## 2020-04-08 NOTE — Telephone Encounter (Signed)
[facility] ben[facility]. Please advise [facility]

## 2020-08-12 ENCOUNTER — Other Ambulatory Visit: Payer: Self-pay | Admitting: Family Medicine

## 2020-08-12 DIAGNOSIS — J329 Chronic sinusitis, unspecified: Secondary | ICD-10-CM

## 2020-08-12 NOTE — Telephone Encounter (Signed)
Please see documentation-I called patient to see if he requested this medication, he returned my call.

## 2020-08-12 NOTE — Telephone Encounter (Signed)
Pt returned Jerry Powers's call  States just has a little cough and cold and that usually helps his cough  confident that it is only cold/cough  44 yo sick recently, was tested not covid, RSV  And he also has 43 week old at home and wants to have meds to help with the cough

## 2020-08-12 NOTE — Telephone Encounter (Signed)
Last filled 04/08/20. Left message asking patient if he requested this medication and if he was having acute symptoms.

## 2021-04-16 ENCOUNTER — Other Ambulatory Visit: Payer: Self-pay

## 2021-04-16 ENCOUNTER — Ambulatory Visit: Payer: BC Managed Care – PPO | Admitting: Family Medicine

## 2021-04-16 ENCOUNTER — Encounter: Payer: Self-pay | Admitting: Family Medicine

## 2021-04-16 VITALS — BP 102/72 | HR 55 | Temp 96.7°F | Ht 72.0 in | Wt 213.6 lb

## 2021-04-16 DIAGNOSIS — R1031 Right lower quadrant pain: Secondary | ICD-10-CM

## 2021-04-16 NOTE — Progress Notes (Signed)
   Subjective:    Patient ID: Jerry Powers, male    DOB: [birth year], 45 y.o.   MRN: [id number]  HPI He is here for evaluation of difficulty with a 7-week history of right lower quadrant abdominal pain.  No history of injury or overuse.  He states that in the abdominal contraction causes pain in the inguinal area near the inguinal canal.  Specifically whether he is doing abdominal crunches or push-ups, he will have pain in that area.   Review of Systems     Objective:   Physical Exam Alert and in no distress.  Inguinal exam shows no evidence of hernia.  Questionable discomfort at the proximal inguinal canal area is noted when he contracts his abdominal musculature.       Assessment & Plan:  Right inguinal pain I will refer to Dr. Junius Roads for further ultrasound evaluation.  Sports hernia is certainly a consideration but I to make sure there is nothing else going on in that area.

## 2021-04-23 ENCOUNTER — Other Ambulatory Visit: Payer: Self-pay

## 2021-04-23 ENCOUNTER — Ambulatory Visit (INDEPENDENT_AMBULATORY_CARE_PROVIDER_SITE_OTHER): Payer: BC Managed Care – PPO | Admitting: Family Medicine

## 2021-04-23 ENCOUNTER — Ambulatory Visit: Payer: Self-pay

## 2021-04-23 DIAGNOSIS — R1031 Right lower quadrant pain: Secondary | ICD-10-CM

## 2021-04-23 NOTE — Progress Notes (Signed)
   Office Visit Note   Patient: Jerry Powers           Date of Birth: 07/16/1976           MRN: [id number] Visit Date: 04/23/2021 Requested by: Denita Lung, MD [place],  [place] [address] PCP: Denita Lung, MD  Subjective: Chief Complaint  Patient presents with  . Other    Pain in the right groin area - worsening over the last several weeks, spreading to the ileum. He first noticed this pain with flanks and pushups.    HPI: He is here at the request of Dr. Redmond School for right lower abdominal/groin pain.  Symptoms started about 5 or 6 weeks ago, he first noticed it when doing push-ups and planks.  There was no single moment of injury.  He has had pain in that area ever since, especially when doing those activities.  He has not noticed a lump, no change in bowel or bladder function.  No testicular or scrotal pain.  He does not walk with a limp.  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               ROS:   All other systems were reviewed and are negative.  Objective: Vital Signs: There were no vitals taken for this visit.  Physical Exam:  General:  Alert and oriented, in no acute distress. Pulm:  Breathing unlabored. Psy:  Normal mood, congruent affect. Skin: No rash Right hip: No pain with internal rotation.  No palpable inguinal hernia.  He is very tender to palpation over the right rectus abdominis muscle, pinpoint tender in 1 spot.  Imaging: US Guided Needle Placement - No Linked Charges  Result Date: 04/23/2021 Ultrasound did not reveal an obvious hernia but he does have a deep partial tear of the right rectus abdominal muscle corresponding to his pain.   Assessment & Plan: 1.  right lower abdominal pain, suspect due to rectus abdominis partial tear -He will start exercises at home.  Modalities in the athletic training room.   If symptoms persist we will repeat ultrasound imaging, depending on the findings we might consider MRI scan versus physical therapy referral.     Procedures: No procedures performed        PMFS History: Patient Active Problem List   Diagnosis Date Noted  . Raynaud's disease 10/15/2013  . 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 PES PLANUS 07/30/2009   No past medical history on file.  No family history on file.  No past surgical history on file. Social History   Occupational History  . Not on file  Tobacco Use  . Smoking status: Never Smoker  . Smokeless tobacco: Never Used  Substance and Sexual Activity  . Alcohol use: Yes    Comment: rare  . Drug use: No  . Sexual activity: Yes

## 2021-05-21 ENCOUNTER — Encounter: Payer: Self-pay | Admitting: Family Medicine

## 2021-05-21 ENCOUNTER — Ambulatory Visit: Payer: BC Managed Care – PPO | Admitting: Family Medicine

## 2021-05-21 VITALS — BP 104/72 | HR 66 | Temp 98.2°F | Ht 71.0 in | Wt 214.2 lb

## 2021-05-21 DIAGNOSIS — I7300 Raynaud's syndrome without gangrene: Secondary | ICD-10-CM

## 2021-05-21 DIAGNOSIS — Z0000 Encounter for general adult medical examination without abnormal findings: Secondary | ICD-10-CM

## 2021-05-21 DIAGNOSIS — K219 Gastro-esophageal reflux disease without esophagitis: Secondary | ICD-10-CM

## 2021-05-21 DIAGNOSIS — Z1211 Encounter for screening for malignant neoplasm of colon: Secondary | ICD-10-CM | POA: Diagnosis not present

## 2021-05-21 DIAGNOSIS — Z23 Encounter for immunization: Secondary | ICD-10-CM | POA: Diagnosis not present

## 2021-05-21 DIAGNOSIS — Z1159 Encounter for screening for other viral diseases: Secondary | ICD-10-CM

## 2021-05-21 NOTE — Progress Notes (Signed)
   Subjective:    Patient ID: Jerry Powers, male    DOB: 29-Sep-1976, 45 y.o.   MRN: [id number]  HPI He is here for complete exam.  He does complain of continued difficulty with reflux symptoms.  He is presently taking famotidine 20 mg daily.  He notes that when he misses a dose or stops it, his reflux symptoms come back very quickly.  He does have a previous history of cholecystitis.  He does have a history of Raynaud's disease but has not had one episode of that. Thank you he also is interested in colon cancer screening.  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  His marriage is going well.  He does have a small child at home.  Does not smoke or drink.  Family and social history as well as health maintenance and immunizations was reviewed   Review of Systems  All other systems reviewed and are negative.     Objective:   Physical Exam Alert and in no distress. Tympanic membranes and canals are normal. Pharyngeal area is normal. Neck is supple without adenopathy or thyromegaly. Cardiac exam shows a regular sinus rhythm without murmurs or gallops. Lungs are clear to auscultation.       Assessment & Plan:  Routine general medical examination at a health care facility - Plan: CBC with Differential/Platelet, Comprehensive metabolic panel, Lipid panel  Raynaud's disease without gangrene  Gastroesophageal reflux disease without esophagitis - Plan: Ambulatory referral to Gastroenterology  Screening for colon cancer - Plan: Ambulatory referral to Gastroenterology  Need for hepatitis C screening test - Plan: Hepatitis C antibody  Need for Tdap vaccination - Plan: Tdap vaccine greater than or equal to 7yo IM I explained that I think it is reasonable to get GI involved to look at his C esophageal and stomach area and since he is doing that he has decided to go ahead and do a colonoscopy which I think is reasonable.

## 2021-05-22 LAB — COMPREHENSIVE METABOLIC PANEL
ALT: 19 IU/L (ref 0–44)
AST: 16 IU/L (ref 0–40)
Albumin/Globulin Ratio: 1.7 (ref 1.2–2.2)
Albumin: 4.7 g/dL (ref 4.0–5.0)
Alkaline Phosphatase: 68 IU/L (ref 44–121)
BUN/Creatinine Ratio: 17 (ref 9–20)
BUN: 22 mg/dL (ref 6–24)
Bilirubin Total: 0.5 mg/dL (ref 0.0–1.2)
CO2: 23 mmol/L (ref 20–29)
Calcium: 9.4 mg/dL (ref 8.7–10.2)
Chloride: 102 mmol/L (ref 96–106)
Creatinine, Ser: 1.27 mg/dL (ref 0.76–1.27)
Globulin, Total: 2.7 g/dL (ref 1.5–4.5)
Glucose: 90 mg/dL (ref 65–99)
Potassium: 4.4 mmol/L (ref 3.5–5.2)
Sodium: 140 mmol/L (ref 134–144)
Total Protein: 7.4 g/dL (ref 6.0–8.5)
eGFR: 71 mL/min/{1.73_m2} (ref >59)

## 2021-05-22 LAB — HEPATITIS C ANTIBODY: Hep C Virus Ab: <0.1 s/co ratio (ref 0.0–0.9)

## 2021-05-22 LAB — CBC WITH DIFFERENTIAL/PLATELET
Basophils Absolute: 0.1 10*3/uL (ref 0.0–0.2)
Basos: 1 %
EOS (ABSOLUTE): 0.1 10*3/uL (ref 0.0–0.4)
Eos: 1 %
Hematocrit: 45 % (ref 37.5–51.0)
Hemoglobin: 15.3 g/dL (ref 13.0–17.7)
Immature Grans (Abs): 0 10*3/uL (ref 0.0–0.1)
Immature Granulocytes: 0 %
Lymphocytes Absolute: 2 10*3/uL (ref 0.7–3.1)
Lymphs: 36 %
MCH: 31.5 pg (ref 26.6–33.0)
MCHC: 34 g/dL (ref 31.5–35.7)
MCV: 93 fL (ref 79–97)
Monocytes Absolute: 0.4 10*3/uL (ref 0.1–0.9)
Monocytes: 7 %
Neutrophils Absolute: 3 10*3/uL (ref 1.4–7.0)
Neutrophils: 55 %
Platelets: 288 10*3/uL (ref 150–450)
RBC: 4.86 x10E6/uL (ref 4.14–5.80)
RDW: 11.9 % (ref 11.6–15.4)
WBC: 5.6 10*3/uL (ref 3.4–10.8)

## 2021-05-22 LAB — LIPID PANEL
Chol/HDL Ratio: 4.3 ratio (ref 0.0–5.0)
Cholesterol, Total: 239 mg/dL — ABNORMAL HIGH (ref 100–199)
HDL: 56 mg/dL (ref >39)
LDL Chol Calc (NIH): 152 mg/dL — ABNORMAL HIGH (ref 0–99)
Triglycerides: 174 mg/dL — ABNORMAL HIGH (ref 0–149)
VLDL Cholesterol Cal: 31 mg/dL (ref 5–40)

## 2021-05-24 NOTE — Progress Notes (Signed)
Pt has seen on my chart. Jerry Powers

## 2021-05-26 ENCOUNTER — Encounter: Payer: Self-pay | Admitting: Family Medicine

## 2022-01-07 ENCOUNTER — Encounter: Payer: Self-pay | Admitting: Family Medicine

## 2022-01-07 ENCOUNTER — Other Ambulatory Visit: Payer: Self-pay

## 2022-01-07 ENCOUNTER — Ambulatory Visit (INDEPENDENT_AMBULATORY_CARE_PROVIDER_SITE_OTHER): Payer: 59 | Admitting: Family Medicine

## 2022-01-07 VITALS — BP 102/66 | HR 60 | Temp 97.4°F | Wt 222.4 lb

## 2022-01-07 DIAGNOSIS — J0110 Acute frontal sinusitis, unspecified: Secondary | ICD-10-CM | POA: Diagnosis not present

## 2022-01-07 DIAGNOSIS — J209 Acute bronchitis, unspecified: Secondary | ICD-10-CM | POA: Diagnosis not present

## 2022-01-07 MED ORDER — AZITHROMYCIN 500 MG PO TABS: 500.0000 mg | ORAL_TABLET | Freq: Every day | ORAL | 0 refills | Status: DC

## 2022-01-07 NOTE — Progress Notes (Signed)
° °  Subjective:    Patient ID: Jerry Powers, male    DOB: [birth year], 46 y.o.   MRN: [id number]  HPI He states that in early January he had difficulty with nasal congestion, rhinorrhea and postnasal drainage.  His testing was negative.  This has lingered but about 2 weeks ago the sinus pressure, PND became worse in the rhinorrhea became purulent.  He also now has developed a productive cough.  He also complains of some left ear congestion but no fever, chills or shortness of breath.   Review of Systems     Objective:   Physical Exam Alert and in no distress.  Slight tenderness palpation over the frontal sinuses.  Tympanic membranes and canals are normal. Pharyngeal area is normal. Neck is supple without adenopathy or thyromegaly. Cardiac exam shows a regular sinus rhythm without murmurs or gallops. Lungs are clear to auscultation.        Assessment & Plan:  Acute non-recurrent frontal sinusitis - Plan: azithromycin (ZITHROMAX) 500 MG tablet  Acute bronchitis, unspecified organism - Plan: azithromycin (ZITHROMAX) 500 MG tablet He will call after 7 to 10 days if he still having lingering symptoms for possible refill.

## 2022-01-07 NOTE — Patient Instructions (Signed)
Use Afrin for the flight and NyQuil at night

## 2022-05-29 DIAGNOSIS — K219 Gastro-esophageal reflux disease without esophagitis: Secondary | ICD-10-CM | POA: Insufficient documentation

## 2022-05-30 ENCOUNTER — Ambulatory Visit (INDEPENDENT_AMBULATORY_CARE_PROVIDER_SITE_OTHER): Payer: 59 | Admitting: Family Medicine

## 2022-05-30 ENCOUNTER — Encounter: Payer: Self-pay | Admitting: Family Medicine

## 2022-05-30 VITALS — BP 112/76 | HR 58 | Temp 97.0°F | Ht 72.0 in | Wt 218.4 lb

## 2022-05-30 DIAGNOSIS — Z0000 Encounter for general adult medical examination without abnormal findings: Secondary | ICD-10-CM

## 2022-05-30 DIAGNOSIS — J309 Allergic rhinitis, unspecified: Secondary | ICD-10-CM | POA: Insufficient documentation

## 2022-05-30 DIAGNOSIS — I7300 Raynaud's syndrome without gangrene: Secondary | ICD-10-CM | POA: Diagnosis not present

## 2022-05-30 DIAGNOSIS — Z8619 Personal history of other infectious and parasitic diseases: Secondary | ICD-10-CM

## 2022-05-30 DIAGNOSIS — Z1211 Encounter for screening for malignant neoplasm of colon: Secondary | ICD-10-CM

## 2022-05-30 DIAGNOSIS — K219 Gastro-esophageal reflux disease without esophagitis: Secondary | ICD-10-CM

## 2022-05-30 LAB — CBC WITH DIFFERENTIAL/PLATELET
Basophils Absolute: 0.1 10*3/uL (ref 0.0–0.2)
Basos: 1 %
EOS (ABSOLUTE): 0.1 10*3/uL (ref 0.0–0.4)
Eos: 2 %
Hematocrit: 44.2 % (ref 37.5–51.0)
Hemoglobin: 15.2 g/dL (ref 13.0–17.7)
Immature Grans (Abs): 0 10*3/uL (ref 0.0–0.1)
Immature Granulocytes: 0 %
Lymphocytes Absolute: 2.1 10*3/uL (ref 0.7–3.1)
Lymphs: 35 %
MCH: 32.3 pg (ref 26.6–33.0)
Monocytes: 9 %
Platelets: 262 10*3/uL (ref 150–450)
RBC: 4.71 x10E6/uL (ref 4.14–5.80)
RDW: 12.4 % (ref 11.6–15.4)
WBC: 6 10*3/uL (ref 3.4–10.8)

## 2022-05-30 LAB — COMPREHENSIVE METABOLIC PANEL

## 2022-05-30 LAB — LIPID PANEL

## 2022-05-30 NOTE — Progress Notes (Signed)
Complete physical exam  Patient: Jerry Powers   DOB: [birth year]   46 y.o. Male  MRN: [id number]  Subjective:    Chief Complaint  Patient presents with   Annual Exam    Fasting     Jerry Powers is a 46 y.o. male who presents today for a complete physical exam. He reports consuming a  high protein  diet.  Works out [employer] a week at gym and home.  He generally feels well. He reports sleeping poorly. He does not have additional problems to discuss today.  He does have underlying allergies that he treats periodically and is comfortable with that.  He does have reflux disease and has been using famotidine fairly regularly.  If he skips a dose he can definitely tell it.  Does have a remote history of Raynaud's disease but but has not had any recent difficulty with that.  Also has a history of herpes labialis but again no troubles in the last several years.  He does not smoke or drink.  Keeps his self physically active.  His work and home life are going well.  Family and social history as well as health maintenance and immunizations was reviewed.   Most recent fall risk assessment:    09/29/2016    9:28 AM  [place] in the past year? No     Most recent depression screenings:    05/30/2022    8:19 AM 05/21/2021    2:48 PM  PHQ 2/9 Scores  PHQ - 2 Score 0 0      Patient Active Problem List   Diagnosis Date Noted   Gastroesophageal reflux disease without esophagitis 05/29/2022   Raynaud's disease 10/15/2013   History of herpes labialis 10/15/2013   PES PLANUS 07/30/2009   No past medical history on file. No past surgical history on file. Social History   Tobacco Use   Smoking status: Never   Smokeless tobacco: Never  Substance Use Topics   Alcohol use: Yes    Comment: rare   Drug use: No   No family history on file. Allergies  Allergen Reactions   Sulfa Antibiotics Hives   Codeine Nausea Only   Penicillins Hives    Has patient had a PCN reaction  causing immediate rash, facial/tongue/throat swelling, SOB or lightheadedness with hypotension: No Has patient had a PCN reaction causing severe rash involving mucus membranes or skin necrosis: Yes Has patient had a PCN reaction that required hospitalization: No Has patient had a PCN reaction occurring within the last 10 years: No If all of the above answers are "NO", then may proceed with Cephalosporin use.    Sulfonamide Derivatives       Patient Care Team: Denita Lung, MD as PCP - General (Family Medicine)   Outpatient Medications Prior to Visit  Medication Sig Note   acetaminophen (TYLENOL) 500 MG tablet Take 1,000 mg by mouth every 6 (six) hours as needed for mild pain.    acyclovir (ZOVIRAX) 800 MG tablet Take 800 mg by mouth as needed for lip care.    cetirizine (ZYRTEC) 10 MG tablet Take 10 mg by mouth daily.    Cholecalciferol (VITAMIN D) 2000 units CAPS Take 2,000 Units by mouth daily.    famotidine (PEPCID) 20 MG tablet Take 20 mg by mouth as needed for heartburn or indigestion.    fluticasone (FLONASE) 50 MCG/ACT nasal spray Place into both nostrils daily.    Multiple Vitamins-Minerals (MULTIVITAMIN WITH  MINERALS) tablet Take 1 tablet by mouth daily.    naproxen sodium (ALEVE) 220 MG tablet Take 440 mg by mouth daily as needed (pain).    Probiotic Product (PROBIOTIC-10 PO) Take 1 tablet by mouth daily.     vitamin C (ASCORBIC ACID) 500 MG tablet Take 1,500 mg by mouth daily.    azithromycin (ZITHROMAX) 500 MG tablet Take 1 tablet (500 mg total) by mouth daily.    ibuprofen (ADVIL,MOTRIN) 200 MG tablet Take 600 mg by mouth every 6 (six) hours as needed for moderate pain.  (Patient not taking: Reported on 01/07/2022) 05/30/2022: Last taking over three months ago   loratadine (CLARITIN) 10 MG tablet Take 10 mg by mouth daily. (Patient not taking: Reported on 01/07/2022)    phenylephrine (SUDAFED PE) 10 MG TABS tablet Take 10 mg by mouth every 4 (four) hours as needed.    No  facility-administered medications prior to visit.    Review of Systems  All other systems reviewed and are negative.         Objective:  Alert and in no distress. Tympanic membranes and canals are normal. Pharyngeal area is normal. Neck is supple without adenopathy or thyromegaly. Cardiac exam shows a regular sinus rhythm without murmurs or gallops. Lungs are clear to auscultation.     BP Readings from Last 3 Encounters:  01/07/22 102/66  05/21/21 104/72  04/16/21 102/72       Physical Exam   Last CBC Lab Results  Component Value Date   WBC 5.6 05/21/2021   HGB 15.3 05/21/2021   HCT 45.0 05/21/2021   MCV 93 05/21/2021   MCH 31.5 05/21/2021   RDW 11.9 05/21/2021   PLT 288 25/95/6387   Last metabolic panel Lab Results  Component Value Date   GLUCOSE 90 05/21/2021   NA 140 05/21/2021   K 4.4 05/21/2021   CL 102 05/21/2021   CO2 23 05/21/2021   BUN 22 05/21/2021   CREATININE 1.27 05/21/2021   EGFR 71 05/21/2021   CALCIUM 9.4 05/21/2021   PROT 7.4 05/21/2021   ALBUMIN 4.7 05/21/2021   LABGLOB 2.7 05/21/2021   AGRATIO 1.7 05/21/2021   BILITOT 0.5 05/21/2021   ALKPHOS 68 05/21/2021   AST 16 05/21/2021   ALT 19 05/21/2021   Last lipids Lab Results  Component Value Date   CHOL 239 (H) 05/21/2021   HDL 56 05/21/2021   LDLCALC 152 (H) 05/21/2021   TRIG 174 (H) 05/21/2021   CHOLHDL 4.3 05/21/2021        Assessment & Plan:    Routine general medical examination at a health care facility - Plan: CBC with Differential/Platelet, Comprehensive metabolic panel, Lipid panel  Gastroesophageal reflux disease without esophagitis - Plan: Ambulatory referral to Gastroenterology  Raynaud's disease without gangrene  History of herpes labialis  Allergic rhinitis, unspecified seasonality, unspecified trigger  Screening for colon cancer - Plan: Cologuard  Immunization History  Administered Date(s) Administered   Influenza Split 09/23/2014   Influenza,inj,Quad  PF,6+ Mos 10/15/2013   Influenza-Unspecified 10/01/2015, 09/19/2016, 09/20/2017, 10/13/2021   Moderna Sars-Covid-2 Vaccination 10/31/2020   PFIZER(Purple Top)SARS-COV-2 Vaccination 02/07/2020, 02/29/2020   PPD Test 07/25/2018   Tdap 12/16/2009, 05/21/2021    Health Maintenance  Topic Date Due   COLONOSCOPY (Pts 45-43yr Insurance coverage will need to be confirmed)  Never done   COVID-19 Vaccine (4 - Pfizer series) 12/26/2020   INFLUENZA VACCINE  07/12/2022   TETANUS/TDAP  05/22/2031   Hepatitis C Screening  Completed   HIV Screening  Completed   HPV VACCINES  Aged Out    Discussed health benefits of physical activity, and encouraged him to engage in regular exercise appropriate for his age and condition.  Problem List Items Addressed This Visit       Cardiovascular and Mediastinum   Raynaud's disease     Digestive   Gastroesophageal reflux disease without esophagitis     Other   History of herpes labialis   Other Visit Diagnoses     Routine general medical examination at a health care facility    -  Primary   Allergic rhinitis, unspecified seasonality, unspecified trigger         Since he has had reflux symptoms for quite some time, I think it is reasonable to have GI look at him.  He will continue to use OTC meds for his allergies and continue on famotidine.     Elyse Jarvis, RMA

## 2022-05-31 LAB — CBC WITH DIFFERENTIAL/PLATELET
MCHC: 34.4 g/dL (ref 31.5–35.7)
MCV: 94 fL (ref 79–97)
Monocytes Absolute: 0.5 10*3/uL (ref 0.1–0.9)
Neutrophils Absolute: 3.2 10*3/uL (ref 1.4–7.0)
Neutrophils: 53 %

## 2022-05-31 LAB — LIPID PANEL
HDL: 49 mg/dL (ref >39)
VLDL Cholesterol Cal: 26 mg/dL (ref 5–40)

## 2022-05-31 LAB — COMPREHENSIVE METABOLIC PANEL
ALT: 18 IU/L (ref 0–44)
Albumin/Globulin Ratio: 1.8 (ref 1.2–2.2)
Albumin: 4.7 g/dL (ref 4.0–5.0)
Alkaline Phosphatase: 71 IU/L (ref 44–121)
BUN: 20 mg/dL (ref 6–24)
CO2: 23 mmol/L (ref 20–29)
Calcium: 9.4 mg/dL (ref 8.7–10.2)
Chloride: 102 mmol/L (ref 96–106)
Globulin, Total: 2.6 g/dL (ref 1.5–4.5)
Potassium: 4.3 mmol/L (ref 3.5–5.2)

## 2022-06-12 LAB — COLOGUARD: COLOGUARD: NEGATIVE

## 2022-08-17 ENCOUNTER — Encounter: Payer: Self-pay | Admitting: Internal Medicine

## 2022-09-20 ENCOUNTER — Encounter: Payer: Self-pay | Admitting: Internal Medicine

## 2022-10-03 ENCOUNTER — Encounter: Payer: Self-pay | Admitting: Internal Medicine

## 2023-06-08 ENCOUNTER — Encounter: Payer: Self-pay | Admitting: Family Medicine

## 2023-06-08 ENCOUNTER — Ambulatory Visit (INDEPENDENT_AMBULATORY_CARE_PROVIDER_SITE_OTHER): Payer: 59 | Admitting: Family Medicine

## 2023-06-08 VITALS — BP 98/66 | HR 52 | Temp 98.0°F | Resp 16 | Ht 72.0 in | Wt 268.0 lb

## 2023-06-08 DIAGNOSIS — Z8619 Personal history of other infectious and parasitic diseases: Secondary | ICD-10-CM | POA: Diagnosis not present

## 2023-06-08 DIAGNOSIS — I7300 Raynaud's syndrome without gangrene: Secondary | ICD-10-CM

## 2023-06-08 DIAGNOSIS — K219 Gastro-esophageal reflux disease without esophagitis: Secondary | ICD-10-CM

## 2023-06-08 DIAGNOSIS — J309 Allergic rhinitis, unspecified: Secondary | ICD-10-CM

## 2023-06-08 DIAGNOSIS — Z1211 Encounter for screening for malignant neoplasm of colon: Secondary | ICD-10-CM

## 2023-06-08 DIAGNOSIS — Z0000 Encounter for general adult medical examination without abnormal findings: Secondary | ICD-10-CM | POA: Diagnosis not present

## 2023-06-08 DIAGNOSIS — M1990 Unspecified osteoarthritis, unspecified site: Secondary | ICD-10-CM

## 2023-06-08 LAB — POCT URINALYSIS DIP (CLINITEK)
Bilirubin, UA: NEGATIVE
Blood, UA: NEGATIVE
Glucose, UA: NEGATIVE mg/dL
Ketones, POC UA: NEGATIVE mg/dL
Leukocytes, UA: NEGATIVE
Nitrite, UA: NEGATIVE
POC PROTEIN,UA: NEGATIVE
Spec Grav, UA: 1.015 (ref 1.010–1.025)
Urobilinogen, UA: 0.2 E.U./dL
pH, UA: 7 (ref 5.0–8.0)

## 2023-06-08 LAB — COMPREHENSIVE METABOLIC PANEL
ALT: 22 IU/L (ref 0–44)
AST: 26 IU/L (ref 0–40)
Albumin: 4.7 g/dL (ref 4.1–5.1)
Alkaline Phosphatase: 70 IU/L (ref 44–121)
BUN/Creatinine Ratio: 19 (ref 9–20)
BUN: 23 mg/dL (ref 6–24)
Bilirubin Total: 0.7 mg/dL (ref 0.0–1.2)
CO2: 23 mmol/L (ref 20–29)
Calcium: 9.4 mg/dL (ref 8.7–10.2)
Chloride: 101 mmol/L (ref 96–106)
Creatinine, Ser: 1.23 mg/dL (ref 0.76–1.27)
Globulin, Total: 2.6 g/dL (ref 1.5–4.5)
Glucose: 95 mg/dL (ref 70–99)
Potassium: 4.5 mmol/L (ref 3.5–5.2)
Sodium: 138 mmol/L (ref 134–144)
Total Protein: 7.3 g/dL (ref 6.0–8.5)
eGFR: 72 mL/min/{1.73_m2} (ref >59)

## 2023-06-08 LAB — LIPID PANEL
Chol/HDL Ratio: 3.9 ratio (ref 0.0–5.0)
Cholesterol, Total: 220 mg/dL — ABNORMAL HIGH (ref 100–199)
HDL: 56 mg/dL (ref >39)
LDL Chol Calc (NIH): 149 mg/dL — ABNORMAL HIGH (ref 0–99)
Triglycerides: 85 mg/dL (ref 0–149)
VLDL Cholesterol Cal: 15 mg/dL (ref 5–40)

## 2023-06-08 LAB — CBC WITH DIFFERENTIAL/PLATELET
Basophils Absolute: 0.1 10*3/uL (ref 0.0–0.2)
Basos: 1 %
EOS (ABSOLUTE): 0.1 10*3/uL (ref 0.0–0.4)
Eos: 2 %
Hematocrit: 45.8 % (ref 37.5–51.0)
Hemoglobin: 15 g/dL (ref 13.0–17.7)
Immature Grans (Abs): 0 10*3/uL (ref 0.0–0.1)
Immature Granulocytes: 0 %
Lymphocytes Absolute: 1.9 10*3/uL (ref 0.7–3.1)
Lymphs: 43 %
MCH: 31.1 pg (ref 26.6–33.0)
MCHC: 32.8 g/dL (ref 31.5–35.7)
MCV: 95 fL (ref 79–97)
Monocytes Absolute: 0.3 10*3/uL (ref 0.1–0.9)
Monocytes: 8 %
Neutrophils Absolute: 2 10*3/uL (ref 1.4–7.0)
Neutrophils: 46 %
Platelets: 256 10*3/uL (ref 150–450)
RBC: 4.83 x10E6/uL (ref 4.14–5.80)
RDW: 12.7 % (ref 11.6–15.4)
WBC: 4.5 10*3/uL (ref 3.4–10.8)

## 2023-06-08 NOTE — Progress Notes (Signed)
Complete physical exam  Patient: Jerry Powers   DOB: 14-Feb-1976   46 y.o. Male  MRN: [id number]  Subjective:    Chief Complaint  Patient presents with   Annual Exam    Fasting. No additional concerns.     Jerry Powers is a 47 y.o. male who presents today for a complete physical exam. He reports consuming a general diet. The patient has a physically strenuous job, but has no regular exercise apart from work.  He generally feels fairly well. He reports sleeping poorly.  He recently had an endoscopy which did show evidence of esophagitis.  He presently is on famotidine regularly and uses Prilosec on an as-needed basis.  He has had multiple knee surgeries and does have some arthritic changes that is interfering with some of the activities he would like to be more involved in.  He does have a history of herpes labialis but no issues presently.  His allergies also seem to be under good control.  He has a history of Raynaud's disease and knows how to take care of that.  Otherwise his family and social history as well as health maintenance and immunizations was reviewed.  Work and home life are going well.  He has a 16 and a 32-year-old child.   Most recent fall risk assessment:    06/08/2023    9:23 AM  Fall Risk   Falls in the past year? 0  Number falls in past yr: 0  Injury with Fall? 0  Risk for fall due to : No Fall Risks  Follow up Falls evaluation completed     Most recent depression screenings:    06/08/2023    9:23 AM 05/30/2022    8:19 AM  PHQ 2/9 Scores  PHQ - 2 Score 0 0    Vision:Within last year and Dental: Receives regular dental care    Patient Care Team: Ronnald Nian, MD as PCP - General (Family Medicine)   Outpatient Medications Prior to Visit  Medication Sig   acetaminophen (TYLENOL) 500 MG tablet Take 1,000 mg by mouth every 6 (six) hours as needed for mild pain.   acyclovir (ZOVIRAX) 800 MG tablet Take 800 mg by mouth as needed for lip care.    cetirizine (ZYRTEC) 10 MG tablet Take 10 mg by mouth daily.   Cholecalciferol (VITAMIN D) 2000 units CAPS Take 2,000 Units by mouth daily.   famotidine (PEPCID) 20 MG tablet Take 20 mg by mouth as needed for heartburn or indigestion.   fluticasone (FLONASE) 50 MCG/ACT nasal spray Place into both nostrils daily.   ibuprofen (ADVIL,MOTRIN) 200 MG tablet Take 600 mg by mouth every 6 (six) hours as needed for moderate pain.   Multiple Vitamins-Minerals (MULTIVITAMIN WITH MINERALS) tablet Take 1 tablet by mouth daily.   naproxen sodium (ALEVE) 220 MG tablet Take 440 mg by mouth daily as needed (pain).   vitamin C (ASCORBIC ACID) 500 MG tablet Take 1,500 mg by mouth daily.   [DISCONTINUED] Probiotic Product (PROBIOTIC-10 PO) Take 1 tablet by mouth daily.  (Patient not taking: Reported on 06/08/2023)   No facility-administered medications prior to visit.    Review of Systems  All other systems reviewed and are negative.         Objective:     BP 98/66   Pulse (!) 52   Temp 98 F (36.7 C) (Oral)   Resp 16   Ht 6' (1.829 m)   Wt 268 lb (121.6 kg)  SpO2 95% Comment: room air  BMI 36.35 kg/m    Physical Exam  Alert and in no distress. Tympanic membranes and canals are normal. Pharyngeal area is normal. Neck is supple without adenopathy or thyromegaly. Cardiac exam shows a regular sinus rhythm without murmurs or gallops. Lungs are clear to auscultation.      Assessment & Plan:    Routine general medical examination at a health care facility - Plan: CBC with Differential/Platelet, Comprehensive metabolic panel, Lipid panel, POCT URINALYSIS DIP (CLINITEK)  Gastroesophageal reflux disease without esophagitis  History of herpes labialis  Raynaud's disease without gangrene  Arthritis  Allergic rhinitis, unspecified seasonality, unspecified trigger  Immunization History  Administered Date(s) Administered   DTaP 10/12/1974, 11/11/1974, 02/10/1975, 02/10/1976   IPV 10/12/1974,  11/11/1974, 02/10/1975, 02/10/1976   Influenza Split 09/23/2014   Influenza,inj,Quad PF,6+ Mos 10/15/2013   Influenza-Unspecified 10/01/2015, 09/19/2016, 09/20/2017, 10/13/2021   MMR 08/15/1975, 04/27/1990   Moderna Sars-Covid-2 Vaccination 10/31/2020   PFIZER(Purple Top)SARS-COV-2 Vaccination 02/07/2020, 02/29/2020   PPD Test 07/25/2018   Tdap 12/16/2009, 05/21/2021    Health Maintenance  Topic Date Due   COVID-19 Vaccine (4 - 2023-24 season) 06/24/2023 (Originally 08/12/2022)   INFLUENZA VACCINE  07/13/2023   Fecal DNA (Cologuard)  06/06/2025   DTaP/Tdap/Td (7 - Td or Tdap) 05/22/2031   Hepatitis C Screening  Completed   HIV Screening  Completed   HPV VACCINES  Aged Out    Discussed the reflux symptoms with him and he will continue on his present medication regimen.  Also discussed his arthritis with him and recommended he stay as physically active as possible with that.  Continue on allergy meds as needed.   Problem List Items Addressed This Visit     Allergic rhinitis   Arthritis   Gastroesophageal reflux disease without esophagitis   History of herpes labialis   Raynaud's disease   Other Visit Diagnoses     Routine general medical examination at a health care facility    -  Primary   Relevant Orders   CBC with Differential/Platelet   Comprehensive metabolic panel   Lipid panel   POCT URINALYSIS DIP (CLINITEK) (Completed)      Follow-up in 1 year.    Sharlot Gowda, MD

## 2023-12-15 HISTORY — PX: SKIN BIOPSY: SHX1

## 2023-12-20 ENCOUNTER — Encounter: Payer: Self-pay | Admitting: Family Medicine

## 2024-02-06 ENCOUNTER — Encounter: Payer: Self-pay | Admitting: Internal Medicine

## 2024-06-25 ENCOUNTER — Ambulatory Visit (INDEPENDENT_AMBULATORY_CARE_PROVIDER_SITE_OTHER): Payer: 59 | Admitting: Family Medicine

## 2024-06-25 ENCOUNTER — Encounter: Payer: Self-pay | Admitting: Family Medicine

## 2024-06-25 VITALS — BP 116/72 | HR 52 | Ht 72.0 in | Wt 223.2 lb

## 2024-06-25 DIAGNOSIS — K219 Gastro-esophageal reflux disease without esophagitis: Secondary | ICD-10-CM

## 2024-06-25 DIAGNOSIS — Z0000 Encounter for general adult medical examination without abnormal findings: Secondary | ICD-10-CM | POA: Diagnosis not present

## 2024-06-25 DIAGNOSIS — M1990 Unspecified osteoarthritis, unspecified site: Secondary | ICD-10-CM | POA: Diagnosis not present

## 2024-06-25 DIAGNOSIS — Z13220 Encounter for screening for lipoid disorders: Secondary | ICD-10-CM

## 2024-06-25 DIAGNOSIS — I7300 Raynaud's syndrome without gangrene: Secondary | ICD-10-CM

## 2024-06-25 DIAGNOSIS — Z8619 Personal history of other infectious and parasitic diseases: Secondary | ICD-10-CM

## 2024-06-25 DIAGNOSIS — J301 Allergic rhinitis due to pollen: Secondary | ICD-10-CM

## 2024-06-25 DIAGNOSIS — R748 Abnormal levels of other serum enzymes: Secondary | ICD-10-CM

## 2024-06-25 NOTE — Progress Notes (Signed)
 Complete physical exam  Patient: Jerry Powers   DOB: 08/03/1976   48 y.o. Male  MRN: [id number]  Subjective:    Chief Complaint  Patient presents with   Annual Exam    Fasting. Cpe.     Jerry Powers is a 48 y.o. male who presents today for a complete physical exam.  He reports consuming a general diet. Workouts at home and gym He generally feels well. He reports sleeping fairly well.  His allergies are causing very little difficulty.  He does have some arthritic complaints but none of any [place] significance.  He does have reflux disease and apparently also has a hiatus hernia.  He also has a history of Raynaud's disease but has not had difficulty with that in quite some time.  Work and home life are going quite well.  Most recent fall risk assessment:    06/25/2024    9:31 AM  Fall Risk   Falls in the past year? 0  Number falls in past yr: 0  Injury with Fall? 0  Risk for fall due to : No Fall Risks  Follow up Falls evaluation completed     Most recent depression screenings:    06/25/2024    9:31 AM 06/08/2023    9:23 AM  PHQ 2/9 Scores  PHQ - 2 Score 0 0    Vision:Within last year and Dental: No current dental problems, Receives regular dental care, and No regular dental care     Immunization History  Administered Date(s) Administered   DTaP 10/12/1974, 11/11/1974, 02/10/1975, 02/10/1976   IPV 10/12/1974, 11/11/1974, 02/10/1975, 02/10/1976   Influenza Split 09/23/2014   Influenza,inj,Quad PF,6+ Mos 10/15/2013   Influenza-Unspecified 10/01/2015, 09/19/2016, 09/20/2017, 10/13/2021   MMR 08/15/1975, 04/27/1990   Moderna Sars-Covid-2 Vaccination 10/31/2020   PFIZER(Purple Top)SARS-COV-2 Vaccination 02/07/2020, 02/29/2020   PPD Test 07/25/2018   Tdap 12/16/2009, 05/21/2021    Health Maintenance  Topic Date Due   Hepatitis B Vaccines (1 of 3 - 19+ 3-dose series) Never done   COVID-19 Vaccine (4 - 2024-25 season) 08/13/2023   INFLUENZA VACCINE   07/12/2024   Fecal DNA (Cologuard)  06/06/2025   DTaP/Tdap/Td (7 - Td or Tdap) 05/22/2031   Hepatitis C Screening  Completed   HIV Screening  Completed   HPV VACCINES  Aged Out   Meningococcal B Vaccine  Aged Out  The 10-year ASCVD risk score (Arnett DK, et al., 2019) is: 2.7%   Values used to calculate the score:     Age: 48 years     Clincally relevant sex: Male     Is Non-Hispanic African American: No     Diabetic: No     Tobacco smoker: No     Systolic Blood Pressure: 116 mmHg     Is BP treated: No     HDL Cholesterol: 56 mg/dL     Total Cholesterol: 220 mg/dL   Patient Care Team: Joyce Norleen BROCKS, MD as PCP - General (Family Medicine)   Outpatient Medications Prior to Visit  Medication Sig   acetaminophen (TYLENOL) 500 MG tablet Take 1,000 mg by mouth every 6 (six) hours as needed for mild pain.   acyclovir  (ZOVIRAX ) 800 MG tablet Take 800 mg by mouth as needed for lip care.   cetirizine (ZYRTEC) 10 MG tablet Take 10 mg by mouth daily.   Cholecalciferol (VITAMIN D ) 2000 units CAPS Take 2,000 Units by mouth daily.   famotidine (PEPCID) 20 MG tablet Take 20 mg by mouth  as needed for heartburn or indigestion.   fluticasone (FLONASE) 50 MCG/ACT nasal spray Place into both nostrils daily.   ibuprofen (ADVIL,MOTRIN) 200 MG tablet Take 600 mg by mouth every 6 (six) hours as needed for moderate pain.   Multiple Vitamins-Minerals (MULTIVITAMIN WITH MINERALS) tablet Take 1 tablet by mouth daily.   naproxen sodium (ALEVE) 220 MG tablet Take 440 mg by mouth daily as needed (pain).   vitamin C (ASCORBIC ACID) 500 MG tablet Take 1,500 mg by mouth daily.   No facility-administered medications prior to visit.    Review of Systems  All other systems reviewed and are negative.   Family and social history as well as health maintenance and immunizations was reviewed.     Objective:    BP 116/72   Pulse (!) 52   Ht 6' (1.829 m)   Wt 223 lb 3.2 oz (101.2 kg)   SpO2 97%   BMI 30.27  kg/m    Physical Exam  Alert and in no distress. Tympanic membranes and canals are normal. Pharyngeal area is normal. Neck is supple without adenopathy or thyromegaly. Cardiac exam shows a regular sinus rhythm without murmurs or gallops. Lungs are clear to auscultation.      Assessment & Plan:     Routine general medical examination at a health care facility  Raynaud's disease without gangrene  Gastroesophageal reflux disease without esophagitis - Plan: CBC with Differential/Platelet, Comprehensive metabolic panel with GFR  Arthritis  Seasonal allergic rhinitis due to pollen  History of herpes labialis  Screening for lipid disorders - Plan: Lipid panel, Lipoprotein A (LPA)  Think right now are going quite well for him.  Continue on present medication regimen and recheck here in 1 year. Return in about 1 year (around 06/25/2025).      Norleen Jobs, MD

## 2024-06-26 ENCOUNTER — Ambulatory Visit: Payer: Self-pay | Admitting: Family Medicine

## 2024-06-26 LAB — LIPID PANEL
Chol/HDL Ratio: 3.7 ratio (ref 0.0–5.0)
Cholesterol, Total: 204 mg/dL — ABNORMAL HIGH (ref 100–199)
HDL: 55 mg/dL (ref >39)
LDL Chol Calc (NIH): 132 mg/dL — ABNORMAL HIGH (ref 0–99)
Triglycerides: 96 mg/dL (ref 0–149)
VLDL Cholesterol Cal: 17 mg/dL (ref 5–40)

## 2024-06-26 LAB — CBC WITH DIFFERENTIAL/PLATELET
Basophils Absolute: 0.1 x10E3/uL (ref 0.0–0.2)
Basos: 1 %
EOS (ABSOLUTE): 0.2 x10E3/uL (ref 0.0–0.4)
Eos: 4 %
Hematocrit: 45.6 % (ref 37.5–51.0)
Hemoglobin: 15.2 g/dL (ref 13.0–17.7)
Immature Grans (Abs): 0 x10E3/uL (ref 0.0–0.1)
Immature Granulocytes: 0 %
Lymphocytes Absolute: 1.2 x10E3/uL (ref 0.7–3.1)
Lymphs: 30 %
MCH: 31.9 pg (ref 26.6–33.0)
MCHC: 33.3 g/dL (ref 31.5–35.7)
MCV: 96 fL (ref 79–97)
Monocytes Absolute: 0.4 x10E3/uL (ref 0.1–0.9)
Monocytes: 10 %
Neutrophils Absolute: 2.3 x10E3/uL (ref 1.4–7.0)
Neutrophils: 55 %
Platelets: 252 x10E3/uL (ref 150–450)
RBC: 4.77 x10E6/uL (ref 4.14–5.80)
RDW: 12.2 % (ref 11.6–15.4)
WBC: 4.2 x10E3/uL (ref 3.4–10.8)

## 2024-06-26 LAB — LIPOPROTEIN A (LPA): Lipoprotein (a): 174.7 nmol/L — ABNORMAL HIGH (ref <75.0)

## 2024-06-26 LAB — COMPREHENSIVE METABOLIC PANEL WITH GFR
ALT: 126 IU/L — ABNORMAL HIGH (ref 0–44)
AST: 72 IU/L — ABNORMAL HIGH (ref 0–40)
Albumin: 4.6 g/dL (ref 4.1–5.1)
Alkaline Phosphatase: 69 IU/L (ref 44–121)
BUN/Creatinine Ratio: 20 (ref 9–20)
BUN: 22 mg/dL (ref 6–24)
Bilirubin Total: 0.6 mg/dL (ref 0.0–1.2)
CO2: 20 mmol/L (ref 20–29)
Calcium: 9.3 mg/dL (ref 8.7–10.2)
Chloride: 103 mmol/L (ref 96–106)
Creatinine, Ser: 1.09 mg/dL (ref 0.76–1.27)
Globulin, Total: 2.7 g/dL (ref 1.5–4.5)
Glucose: 93 mg/dL (ref 70–99)
Potassium: 4.2 mmol/L (ref 3.5–5.2)
Sodium: 138 mmol/L (ref 134–144)
Total Protein: 7.3 g/dL (ref 6.0–8.5)
eGFR: 83 mL/min/1.73 (ref >59)

## 2024-06-26 NOTE — Addendum Note (Signed)
 Addended by: JOYCE NORLEEN BROCKS on: 06/26/2024 08:01 AM   Modules accepted: Orders

## 2024-06-27 ENCOUNTER — Telehealth: Payer: Self-pay

## 2024-06-27 NOTE — Telephone Encounter (Signed)
 Copied from CRM [id number]. Topic: Clinical - Lab/Test Results >> Jun 27, 2024 10:38 AM Graeme ORN wrote: Reason for CRM: Patient called. Returned missed call from [place]. Received message about coming back in. Has questions about why more testing is needed. Thank You  Going to call patient back once labs are in system.

## 2024-08-15 ENCOUNTER — Ambulatory Visit: Payer: Self-pay

## 2024-08-15 NOTE — Telephone Encounter (Signed)
 First attempt to contact pt, no answer, unable to LVM because mailbox full. Placed in call back.    Copied from CRM [id number]. Topic: Clinical - Medical Advice >> Aug 15, 2024 10:35 AM Graeme ORN wrote: Reason for CRM: Patient called. Wife diagnosed with covid today - two young kids at home he has to care for - Would like to ask Dr Joyce to send paxlovid. [id number] - [place], [place] - [address] AT [address]

## 2024-08-15 NOTE — Telephone Encounter (Signed)
 FYI Only or Action Required?: Action required by provider: Paxlovid request.  Patient was last seen in primary care on 06/25/2024 by Joyce Norleen BROCKS, MD.  Called Nurse Triage reporting Covid Exposure.  Symptoms began today.  Interventions attempted: Nothing.  Symptoms are: unchanged.  Triage Disposition: Call PCP Within 24 Hours  Patient/caregiver understands and will follow disposition?: No, wishes to speak with PCP    Reason for Disposition  [1] Patient is NOT HIGH RISK AND [2] strongly requests antiviral medicine AND [3] COVID-19 symptoms present < 5 days  Answer Assessment - Initial Assessment Questions 1. COVID-19 EXPOSURE: Please describe how you were exposed to someone with a COVID-19 infection.     Wife is positive  2. PLACE of CONTACT: Where were you when you were exposed to COVID-19? (e.g., home, school, medical waiting room; which city?)     home 3. TYPE of CONTACT: How much contact was there? (e.g., sitting next to, live in same house, work in same office, same building)     Lives in same home 4. DURATION of CONTACT: How long were you in contact with the COVID-19 patient? (e.g., a few seconds, passed by person, a few minutes, 15 minutes or longer, live with the patient)     prolonged 5. DATE of CONTACT: When did you have contact with a COVID-19 patient? (e.g., hours, days ago)     today 6. MASK: Were you wearing a mask? Was the other person wearing a mask? Note: wearing a mask reduces the risk of an otherwise close contact.     no 7. SYMPTOMS: Do you have any symptoms? (e.g., fever, cough, breathing difficulty, loss of taste or smell)     fatigue 8. COVID-19 VACCINE: Have you had the COVID-19 vaccine? If Yes, ask: When did you last get it?      9. PREGNANCY OR POSTPARTUM: Is there any chance you are pregnant? When was your last menstrual period? Did you deliver in the last 2 weeks?      10. HIGH RISK: Do you have any heart or lung  problems? (e.g., asthma, COPD, heart failure) Do you have a weak immune system or other risk factors? (e.g., HIV positive, chemotherapy, renal failure, diabetes mellitus, sickle cell anemia, obesity)  Answer Assessment - Initial Assessment Questions Additional info: Requesting paxlovid to [facility] pharmacy   1. SYMPTOMS: What is your main symptom or concern? (e.g., cough, fever, shortness of breath, muscle aches)     Fatigue, scratchy throat 2. ONSET: When did the symptoms start?      today 3. COUGH: Do you have a cough? If Yes, ask: How bad is the cough?       no 4. FEVER: Do you have a fever? If Yes, ask: What is your temperature, how was it measured, and when did it start?     denies 5. BREATHING DIFFICULTY: Are you having any difficulty breathing? (e.g., normal; shortness of breath, wheezing, unable to speak)      denies 6. BETTER-SAME-WORSE: Are you getting better, staying the same or getting worse compared to yesterday?  If getting worse, ask, In what way?     N/A 7. OTHER SYMPTOMS: Do you have any other symptoms?  (e.g., chills, fatigue, headache, loss of smell or taste, muscle pain, sore throat)     none 8. COVID-19 DIAGNOSIS: How do you know that you have COVID? (e.g., positive lab test or self-test, diagnosed by doctor or NP/PA, symptoms after exposure).     Not tested 9.  COVID-19 EXPOSURE: Was there any known exposure to COVID before the symptoms began?  Yes, wife tested positive today.  Protocols used: COVID-19 - Exposure-A-AH, COVID-19 - Diagnosed or Suspected-A-AH

## 2025-06-27 ENCOUNTER — Encounter: Payer: Self-pay | Admitting: Family Medicine
